# Patient Record
Sex: Female | Born: 1965 | Race: White | Hispanic: No | Marital: Married | Smoking: Former smoker
Health system: Southern US, Community
[De-identification: ages and names within clinical notes are randomized; demographics above are authoritative.]

## PROBLEM LIST (undated history)

## (undated) ENCOUNTER — Ambulatory Visit: Admission: EM | Discharge status: Against Medical Advice | Payer: 59 | Source: Home / Self Care

## (undated) DIAGNOSIS — M1990 Unspecified osteoarthritis, unspecified site: Secondary | ICD-10-CM

## (undated) DIAGNOSIS — M329 Systemic lupus erythematosus, unspecified: Secondary | ICD-10-CM

## (undated) DIAGNOSIS — R569 Unspecified convulsions: Secondary | ICD-10-CM

## (undated) DIAGNOSIS — G629 Polyneuropathy, unspecified: Secondary | ICD-10-CM

## (undated) DIAGNOSIS — L930 Discoid lupus erythematosus: Secondary | ICD-10-CM

## (undated) DIAGNOSIS — Z22322 Carrier or suspected carrier of Methicillin resistant Staphylococcus aureus: Secondary | ICD-10-CM

## (undated) DIAGNOSIS — I10 Essential (primary) hypertension: Secondary | ICD-10-CM

## (undated) DIAGNOSIS — L719 Rosacea, unspecified: Secondary | ICD-10-CM

## (undated) DIAGNOSIS — E785 Hyperlipidemia, unspecified: Secondary | ICD-10-CM

## (undated) DIAGNOSIS — F419 Anxiety disorder, unspecified: Secondary | ICD-10-CM

## (undated) HISTORY — PX: GANGLION CYST EXCISION: SHX1691

## (undated) HISTORY — PX: ABDOMINAL HYSTERECTOMY: SHX81

---

## 2009-12-10 ENCOUNTER — Ambulatory Visit: Payer: Self-pay | Admitting: Family Medicine

## 2012-10-30 ENCOUNTER — Ambulatory Visit: Payer: Self-pay | Admitting: Family Medicine

## 2013-01-28 ENCOUNTER — Ambulatory Visit: Payer: Self-pay | Admitting: Physician Assistant

## 2014-01-30 ENCOUNTER — Ambulatory Visit: Payer: Self-pay | Admitting: Obstetrics and Gynecology

## 2015-06-12 ENCOUNTER — Encounter: Payer: Self-pay | Admitting: *Deleted

## 2015-06-13 ENCOUNTER — Encounter
Admission: RE | Disposition: A | Discharge status: Home Health | Payer: Self-pay | Source: Ambulatory Visit | Attending: Gastroenterology

## 2015-06-13 ENCOUNTER — Ambulatory Visit
Admission: RE | Admit: 2015-06-13 | Discharge: 2015-06-13 | Disposition: A | Discharge status: Home Health | Payer: BLUE CROSS/BLUE SHIELD | Source: Ambulatory Visit | Attending: Gastroenterology | Admitting: Gastroenterology

## 2015-06-13 ENCOUNTER — Encounter: Payer: Self-pay | Admitting: *Deleted

## 2015-06-13 ENCOUNTER — Ambulatory Visit: Payer: BLUE CROSS/BLUE SHIELD | Admitting: Certified Registered"

## 2015-06-13 DIAGNOSIS — Z8614 Personal history of Methicillin resistant Staphylococcus aureus infection: Secondary | ICD-10-CM | POA: Insufficient documentation

## 2015-06-13 DIAGNOSIS — Z881 Allergy status to other antibiotic agents status: Secondary | ICD-10-CM | POA: Insufficient documentation

## 2015-06-13 DIAGNOSIS — I10 Essential (primary) hypertension: Secondary | ICD-10-CM | POA: Insufficient documentation

## 2015-06-13 DIAGNOSIS — Z87891 Personal history of nicotine dependence: Secondary | ICD-10-CM | POA: Insufficient documentation

## 2015-06-13 DIAGNOSIS — K621 Rectal polyp: Secondary | ICD-10-CM | POA: Insufficient documentation

## 2015-06-13 DIAGNOSIS — M1990 Unspecified osteoarthritis, unspecified site: Secondary | ICD-10-CM | POA: Insufficient documentation

## 2015-06-13 DIAGNOSIS — F419 Anxiety disorder, unspecified: Secondary | ICD-10-CM | POA: Diagnosis not present

## 2015-06-13 DIAGNOSIS — R194 Change in bowel habit: Secondary | ICD-10-CM | POA: Diagnosis present

## 2015-06-13 DIAGNOSIS — Z882 Allergy status to sulfonamides status: Secondary | ICD-10-CM | POA: Diagnosis not present

## 2015-06-13 DIAGNOSIS — E785 Hyperlipidemia, unspecified: Secondary | ICD-10-CM | POA: Diagnosis not present

## 2015-06-13 DIAGNOSIS — Z888 Allergy status to other drugs, medicaments and biological substances status: Secondary | ICD-10-CM | POA: Diagnosis not present

## 2015-06-13 DIAGNOSIS — R1032 Left lower quadrant pain: Secondary | ICD-10-CM | POA: Diagnosis not present

## 2015-06-13 DIAGNOSIS — H01129 Discoid lupus erythematosus of unspecified eye, unspecified eyelid: Secondary | ICD-10-CM | POA: Diagnosis not present

## 2015-06-13 HISTORY — DX: Unspecified convulsions: R56.9

## 2015-06-13 HISTORY — PX: COLONOSCOPY WITH PROPOFOL: SHX5780

## 2015-06-13 HISTORY — DX: Hyperlipidemia, unspecified: E78.5

## 2015-06-13 HISTORY — DX: Discoid lupus erythematosus: L93.0

## 2015-06-13 HISTORY — DX: Polyneuropathy, unspecified: G62.9

## 2015-06-13 HISTORY — DX: Essential (primary) hypertension: I10

## 2015-06-13 HISTORY — DX: Rosacea, unspecified: L71.9

## 2015-06-13 HISTORY — DX: Anxiety disorder, unspecified: F41.9

## 2015-06-13 HISTORY — DX: Unspecified osteoarthritis, unspecified site: M19.90

## 2015-06-13 HISTORY — DX: Carrier or suspected carrier of methicillin resistant Staphylococcus aureus: Z22.322

## 2015-06-13 SURGERY — COLONOSCOPY WITH PROPOFOL
Anesthesia: General

## 2015-06-13 MED ORDER — SODIUM CHLORIDE 0.9 % IV SOLN
INTRAVENOUS | Status: DC
  Administered 2015-06-13: 15:00:00 via INTRAVENOUS

## 2015-06-13 MED ORDER — LIDOCAINE HCL (CARDIAC) 20 MG/ML IV SOLN
INTRAVENOUS | Status: DC | PRN
  Administered 2015-06-13: 50 mg via INTRAVENOUS

## 2015-06-13 MED ORDER — FENTANYL CITRATE (PF) 100 MCG/2ML IJ SOLN
INTRAMUSCULAR | Status: DC | PRN
  Administered 2015-06-13: 50 ug via INTRAVENOUS

## 2015-06-13 MED ORDER — GLYCOPYRROLATE 0.2 MG/ML IJ SOLN
INTRAMUSCULAR | Status: DC | PRN
  Administered 2015-06-13: 0.2 mg via INTRAVENOUS

## 2015-06-13 MED ORDER — MIDAZOLAM HCL 5 MG/5ML IJ SOLN
INTRAMUSCULAR | Status: DC | PRN
  Administered 2015-06-13: 1 mg via INTRAVENOUS

## 2015-06-13 MED ORDER — PROPOFOL 10 MG/ML IV BOLUS
INTRAVENOUS | Status: DC | PRN
  Administered 2015-06-13: 70 mg via INTRAVENOUS

## 2015-06-13 MED ORDER — PROPOFOL 500 MG/50ML IV EMUL
INTRAVENOUS | Status: DC | PRN
  Administered 2015-06-13: 120 ug/kg/min via INTRAVENOUS

## 2015-06-13 MED ORDER — SODIUM CHLORIDE 0.9 % IV SOLN: INTRAVENOUS | Status: DC

## 2015-06-13 NOTE — H&P (Signed)
Outpatient short stay form Pre-procedure 06/13/2015 3:14 PM Rebecca DeemMartin U Skulskie MD  Primary Physician: Dr. Jerl MinaJames Hedrick  Reason for visit:  Left lower quadrant pain, diarrhea, change of bowel habits  History of present illness:  Patient is a 49 year old female presenting with a long history of possible irritable bowel syndrome. She states this began when she was quite young. She has alternating bowel habits and incomplete defecation symptoms. He recently had several courses of antibiotics. She has had no previous luminal evaluation. She tolerated her prep well. She takes no aspirin or blood thinning products.  He did relate that eating a fair number of fiber one bars, also has symptoms of loose stools and gas when she eats ice cream. Raises the question of possible lactose maldigestion.    Current facility-administered medications:  .  0.9 %  sodium chloride infusion, , Intravenous, Continuous, Rebecca DeemMartin U Skulskie, MD, Last Rate: 20 mL/hr at 06/13/15 1445 .  0.9 %  sodium chloride infusion, , Intravenous, Continuous, Rebecca DeemMartin U Skulskie, MD  Prescriptions prior to admission  Medication Sig Dispense Refill Last Dose  . clonazePAM (KLONOPIN) 1 MG tablet Take 1 mg by mouth 2 (two) times daily.   06/13/2015 at 1000  . cyanocobalamin (,VITAMIN B-12,) 1000 MCG/ML injection Inject 1,000 mcg into the muscle once.   Past Month at Unknown time  . enalapril (VASOTEC) 20 MG tablet Take 30 mg by mouth daily.   06/13/2015 at 1000  . ergocalciferol (VITAMIN D2) 50000 UNITS capsule Take 50,000 Units by mouth once a week.   Past Week at Unknown time  . estradiol (ESTRACE) 2 MG tablet Take 2 mg by mouth daily.   06/13/2015 at 1000  . gabapentin (NEURONTIN) 300 MG capsule Take 300 mg by mouth 2 (two) times daily. Take one capsule every morning and two capsules every night p.o.   06/13/2015 at 1000  . metoprolol tartrate (LOPRESSOR) 25 MG tablet Take 12.5 mg by mouth 2 (two) times daily.   06/13/2015 at 1000  .  PARoxetine (PAXIL) 10 MG tablet Take 10 mg by mouth daily.   06/12/2015 at Unknown time  . hydroxychloroquine (PLAQUENIL) 200 MG tablet Take by mouth daily.   Not Taking at Unknown time     Allergies  Allergen Reactions  . Amoxicillin   . Dilantin [Phenytoin Sodium Extended]   . Doxycycline   . Phenobarbital   . Sulfa Antibiotics      Past Medical History  Diagnosis Date  . Hypertension   . Anxiety   . Seizures (HCC)   . Arthritis   . Hyperlipidemia   . Discoid lupus   . Rosacea   . Neuropathy (HCC)   . MRSA (methicillin resistant staph aureus) culture positive     Review of systems:      Physical Exam    Heart and lungs: Irregular rate and rhythm without rub or gallop, lungs are bilaterally clear    HEENT: Normocephalic atraumatic eyes are anicteric    Other:     Pertinant exam for procedure: Soft nontender nondistended bowel sounds positive normoactive    Planned proceedures: Colonoscopy and indicated procedures. I have discussed the risks benefits and complications of procedures to include not limited to bleeding, infection, perforation and the risk of sedation and the patient wishes to proceed.    Rebecca DeemMartin U Skulskie, MD Gastroenterology 06/13/2015  3:14 PM

## 2015-06-13 NOTE — Anesthesia Preprocedure Evaluation (Signed)
Anesthesia Evaluation  Patient identified by MRN, date of birth, ID band Patient awake    Reviewed: Allergy & Precautions, H&P , NPO status , Patient's Chart, lab work & pertinent test results  Airway Mallampati: II  TM Distance: >3 FB Neck ROM: full    Dental  (+) Poor Dentition   Pulmonary neg pulmonary ROS, former smoker,    Pulmonary exam normal breath sounds clear to auscultation       Cardiovascular Exercise Tolerance: Good hypertension, (-) angina(-) Past MI and (-) DOE Normal cardiovascular exam Rhythm:regular Rate:Normal     Neuro/Psych Seizures -, Well Controlled,  PSYCHIATRIC DISORDERS Anxiety    GI/Hepatic negative GI ROS, Neg liver ROS,   Endo/Other  negative endocrine ROS  Renal/GU negative Renal ROS  negative genitourinary   Musculoskeletal  (+) Arthritis ,   Abdominal   Peds  Hematology negative hematology ROS (+)   Anesthesia Other Findings Past Medical History:   Hypertension                                                 Anxiety                                                      Seizures (HCC)                                               Arthritis                                                    Hyperlipidemia                                               Discoid lupus                                                Rosacea                                                      Neuropathy (HCC)                                             MRSA (methicillin resistant staph aureus) cult*             Past Surgical History:   ABDOMINAL HYSTERECTOMY  GANGLION CYST EXCISION                                       BMI    Body Mass Index   22.28 kg/m 2      Reproductive/Obstetrics negative OB ROS                             Anesthesia Physical Anesthesia Plan  ASA: III  Anesthesia Plan: General   Post-op Pain Management:     Induction:   Airway Management Planned:   Additional Equipment:   Intra-op Plan:   Post-operative Plan:   Informed Consent: I have reviewed the patients History and Physical, chart, labs and discussed the procedure including the risks, benefits and alternatives for the proposed anesthesia with the patient or authorized representative who has indicated his/her understanding and acceptance.   Dental Advisory Given  Plan Discussed with: Anesthesiologist, CRNA and Surgeon  Anesthesia Plan Comments:         Anesthesia Quick Evaluation

## 2015-06-13 NOTE — Anesthesia Postprocedure Evaluation (Signed)
  Anesthesia Post-op Note  Patient: Rebecca PointsSharon L Boone  Procedure(s) Performed: Procedure(s): COLONOSCOPY WITH PROPOFOL (N/A)  Anesthesia type:General  Patient location: PACU  Post pain: Pain level controlled  Post assessment: Post-op Vital signs reviewed, Patient's Cardiovascular Status Stable, Respiratory Function Stable, Patent Airway and No signs of Nausea or vomiting  Post vital signs: Reviewed and stable  Last Vitals:  Filed Vitals:   06/13/15 1630  BP: 136/76  Pulse: 52  Temp:   Resp: 11    Level of consciousness: awake, alert  and patient cooperative  Complications: No apparent anesthesia complications

## 2015-06-13 NOTE — Op Note (Signed)
[facility] Gastroenterology Patient Name: Merna Baldi Procedure Date: 06/13/2015 3:09 PM MRN: [id number] Account #: [id number] Date of Birth: 07-10-1966 Admit Type: Outpatient Age: 49 Room: [facility] ENDO ROOM 3 Gender: Female Note Status: Finalized Procedure:         Colonoscopy Indications:       Change in bowel habits, Diarrhea Providers:         Christena Deem, MD Referring MD:      Christena Deem, MD (Referring MD) Medicines:         Monitored Anesthesia Care Complications:     No immediate complications. Procedure:         Pre-Anesthesia Assessment:                    - ASA Grade Assessment: II - A patient with mild systemic                     disease.                    After obtaining informed consent, the colonoscope was                     passed under direct vision. Throughout the procedure, the                     patient's blood pressure, pulse, and oxygen saturations                     were monitored continuously. The Colonoscope was                     introduced through the anus and advanced to the the cecum,                     identified by appendiceal orifice and ileocecal valve. The                     quality of the bowel preparation was good. The colonoscopy                     was performed without difficulty. The patient tolerated                     the procedure well. Findings:      The sigmoid colon was moderately redundant.      A 2 mm polyp was found in the recto-sigmoid colon. The polyp was       sessile. The polyp was removed with a cold biopsy forceps. Resection and       retrieval were complete.      Three sessile polyps were found in the rectum. The polyps were less than       1 mm in size. These polyps were removed with a cold biopsy forceps.       Resection and retrieval were complete.      The digital rectal exam was normal.      Biopsies for histology were taken with a cold forceps from the right       colon  and left colon for evaluation of microscopic colitis. Impression:        - Redundant colon.                    - One 2 mm polyp at the recto-sigmoid colon.  Resected and                     retrieved.                    - Three less than 1 mm polyps in the rectum. Resected and                     retrieved.                    - Biopsies were taken with a cold forceps from the right                     colon and left colon for evaluation of microscopic colitis. Recommendation:    - Await pathology results.                    - Telephone GI clinic for pathology results in 1 week.                    - Use Citrucel one tablespoon PO daily daily.                    - Check lactose tolerance test at appointment to be                     scheduled.                    - Return to GI clinic in 4 weeks. Procedure Code(s): --- Professional ---                    [phone], Colonoscopy, flexible; with biopsy, single or                     multiple Diagnosis Code(s): --- Professional ---                    211.4, Benign neoplasm of rectum and anal canal                    569.0, Anal and rectal polyp                    787.99, Other symptoms involving digestive system                    787.91, Diarrhea                    751.5, Other anomalies of intestine CPT copyright 2014 American Medical Association. All rights reserved. The codes documented in this report are preliminary and upon coder review may  be revised to meet current compliance requirements. Christena DeemMartin U Humza Tallerico, MD 06/13/2015 3:57:19 PM This report has been signed electronically. Number of Addenda: 0 Note Initiated On: 06/13/2015 3:09 PM Scope Withdrawal Time: 0 hours 17 minutes 59 seconds  Total Procedure Duration: 0 hours 27 minutes 2 seconds       [facility]

## 2015-06-13 NOTE — Transfer of Care (Signed)
Immediate Anesthesia Transfer of Care Note  Patient: Rebecca Boone  Procedure(s) Performed: Procedure(s): COLONOSCOPY WITH PROPOFOL (N/A)  Patient Location: Endoscopy Unit  Anesthesia Type:General  Level of Consciousness: awake  Airway & Oxygen Therapy: Patient Spontanous Breathing and Patient connected to nasal cannula oxygen  Post-op Assessment: Report given to RN  Post vital signs: Reviewed  Last Vitals:  Filed Vitals:   06/13/15 1558  BP: 114/64  Pulse: 51  Temp: 36.4 C  Resp: 14    Complications: No apparent anesthesia complications

## 2015-06-17 LAB — SURGICAL PATHOLOGY

## 2015-06-18 ENCOUNTER — Encounter: Payer: Self-pay | Admitting: Gastroenterology

## 2015-12-03 DIAGNOSIS — Z8669 Personal history of other diseases of the nervous system and sense organs: Secondary | ICD-10-CM

## 2017-04-01 ENCOUNTER — Other Ambulatory Visit: Payer: Self-pay | Admitting: Family Medicine

## 2017-04-01 DIAGNOSIS — Z1231 Encounter for screening mammogram for malignant neoplasm of breast: Secondary | ICD-10-CM

## 2019-12-20 ENCOUNTER — Other Ambulatory Visit: Payer: Self-pay | Admitting: Family Medicine

## 2019-12-20 DIAGNOSIS — Z1231 Encounter for screening mammogram for malignant neoplasm of breast: Secondary | ICD-10-CM

## 2019-12-31 ENCOUNTER — Inpatient Hospital Stay: Admission: RE | Admit: 2019-12-31 | Payer: BLUE CROSS/BLUE SHIELD | Source: Ambulatory Visit

## 2020-01-14 ENCOUNTER — Ambulatory Visit
Admission: RE | Admit: 2020-01-14 | Discharge: 2020-01-14 | Disposition: A | Discharge status: Home / Self Care | Payer: 59 | Source: Ambulatory Visit | Attending: Family Medicine | Admitting: Family Medicine

## 2020-01-14 ENCOUNTER — Other Ambulatory Visit: Payer: Self-pay

## 2020-01-14 DIAGNOSIS — Z1231 Encounter for screening mammogram for malignant neoplasm of breast: Secondary | ICD-10-CM | POA: Insufficient documentation

## 2020-02-26 ENCOUNTER — Encounter: Payer: Self-pay | Admitting: Emergency Medicine

## 2020-02-26 ENCOUNTER — Other Ambulatory Visit: Payer: Self-pay

## 2020-02-26 ENCOUNTER — Ambulatory Visit
Admission: EM | Admit: 2020-02-26 | Discharge: 2020-02-26 | Disposition: A | Discharge status: Home / Self Care | Payer: 59 | Attending: Emergency Medicine | Admitting: Emergency Medicine

## 2020-02-26 DIAGNOSIS — N10 Acute tubulo-interstitial nephritis: Secondary | ICD-10-CM

## 2020-02-26 LAB — URINALYSIS, COMPLETE (UACMP) WITH MICROSCOPIC
Glucose, UA: 250 mg/dL — AB
Ketones, ur: 15 mg/dL — AB
Nitrite: POSITIVE — AB
Protein, ur: >300 mg/dL — AB
Specific Gravity, Urine: 1.01 (ref 1.005–1.030)
WBC, UA: >50 WBC/hpf (ref 0–5)
pH: 5 (ref 5.0–8.0)

## 2020-02-26 MED ORDER — LEVOFLOXACIN 750 MG PO TABS: 750.0000 mg | ORAL_TABLET | Freq: Every day | ORAL | 0 refills | Status: AC

## 2020-02-26 NOTE — ED Provider Notes (Signed)
[facility]    CSN: [id number] Arrival date & time: 02/26/20  1654      History   Chief Complaint Chief Complaint  Patient presents with  . Urinary Retention  . Dysuria    HPI Rebecca Boone is a 54 y.o. female.   Patient is a 54 year old female who presents with chief complaint of urinary symptoms that began last night.  Patient states she had a UTI back in May.  Chart review notes a urine culture from May 12 that returned as a pansensitive E. coli.  Patient was treated with a course of Ceftin.  Patient states her symptoms improved but she had a itchy reaction to the medication, thought to be a crossover from her penicillin allergy.  Patient ports last night she had some pain, urgency, and a bloating sensation.  Patient reports the pain was throughout the urination and afterwards.  Patient also reports left lower back pain as well.  Patient's she has been drinking a lot of water and took Azo last night as well as this morning and this afternoon.  Patient also reports chills and fever last night.     Past Medical History:  Diagnosis Date  . Anxiety   . Arthritis   . Discoid lupus   . Hyperlipidemia   . Hypertension   . MRSA (methicillin resistant staph aureus) culture positive   . Neuropathy   . Rosacea   . Seizures (HCC)     There are no problems to display for this patient.   Past Surgical History:  Procedure Laterality Date  . ABDOMINAL HYSTERECTOMY    . COLONOSCOPY WITH PROPOFOL N/A 06/13/2015   Procedure: COLONOSCOPY WITH PROPOFOL;  Surgeon: Christena Deem, MD;  Location: [facility] ENDOSCOPY;  Service: Endoscopy;  Laterality: N/A;  . GANGLION CYST EXCISION      OB History   No obstetric history on file.      Home Medications    Prior to Admission medications   Medication Sig Start Date End Date Taking? Authorizing Provider  clonazePAM (KLONOPIN) 1 MG tablet Take 1 mg by mouth 2 (two) times daily.   Yes [provider]    cyanocobalamin (,VITAMIN B-12,) 1000 MCG/ML injection Inject 1,000 mcg into the muscle once.   Yes [provider]  enalapril (VASOTEC) 20 MG tablet Take 30 mg by mouth daily.   Yes [provider]  ergocalciferol (VITAMIN D2) 50000 UNITS capsule Take 50,000 Units by mouth once a week.   Yes [provider]  gabapentin (NEURONTIN) 300 MG capsule Take 300 mg by mouth 2 (two) times daily. Take one capsule every morning and two capsules every night p.o.   Yes [provider]  metoprolol tartrate (LOPRESSOR) 25 MG tablet Take 12.5 mg by mouth 2 (two) times daily.   Yes [provider]  PARoxetine (PAXIL) 10 MG tablet Take 10 mg by mouth daily.   Yes [provider]  levofloxacin (LEVAQUIN) 750 MG tablet Take 1 tablet (750 mg total) by mouth daily for 5 days. 02/26/20 03/02/20  Candis Schatz, PA-C  estradiol (ESTRACE) 2 MG tablet Take 2 mg by mouth daily.  02/26/20  [provider]    Family History Family History  Problem Relation Age of Onset  . Healthy Mother     Social History Social History   Tobacco Use  . Smoking status: Former Games developer  . Smokeless tobacco: Never Used  Vaping Use  . Vaping Use: Never used  Substance Use  Topics  . Alcohol use: No  . Drug use: No     Allergies   Amoxicillin, Dilantin [phenytoin sodium extended], Doxycycline, Phenobarbital, and Sulfa antibiotics   Review of Systems Review of Systems as noted above in HPI.  Other system reviewed and found to be negative.   Physical Exam Triage Vital Signs ED Triage Vitals  Enc Vitals Group     BP 02/26/20 1723 133/69     Pulse Rate 02/26/20 1723 (!) 55     Resp 02/26/20 1723 18     Temp 02/26/20 1723 98.2 F (36.8 C)     Temp Source 02/26/20 1723 Oral     SpO2 02/26/20 1723 98 %     Weight 02/26/20 1719 138 lb 0.1 oz (62.6 kg)     Height 02/26/20 1719 5\' 6"  (1.676 m)     Head Circumference --      Peak Flow --      Pain Score 02/26/20  1719 6     Pain Loc --      Pain Edu? --      Excl. in GC? --    No data found.  Updated Vital Signs BP 133/69 (BP Location: Right Arm)   Pulse (!) 55   Temp 98.2 F (36.8 C) (Oral)   Resp 18   Ht 5\' 6"  (1.676 m)   Wt 138 lb 0.1 oz (62.6 kg)   SpO2 98%   BMI 22.28 kg/m   Visual Acuity Right Eye Distance:   Left Eye Distance:   Bilateral Distance:    Right Eye Near:   Left Eye Near:    Bilateral Near:     Physical Exam Constitutional:      Appearance: Normal appearance. She is not ill-appearing.  Cardiovascular:     Rate and Rhythm: Normal rate and regular rhythm.     Heart sounds: Normal heart sounds.  Pulmonary:     Effort: Pulmonary effort is normal.     Breath sounds: Normal breath sounds.  Abdominal:     General: Abdomen is flat.     Palpations: Abdomen is soft.     Tenderness: There is left CVA tenderness. There is no right CVA tenderness.  Skin:    General: Skin is warm and dry.     Capillary Refill: Capillary refill takes less than 2 seconds.  Neurological:     General: No focal deficit present.     Mental Status: She is alert and oriented to person, place, and time.      UC Treatments / Results  Labs (all labs ordered are listed, but only abnormal results are displayed) Labs Reviewed  URINALYSIS, COMPLETE (UACMP) WITH MICROSCOPIC - Abnormal; Notable for the following components:      Result Value   Color, Urine ORANGE (*)    APPearance CLOUDY (*)    Glucose, UA 250 (*)    Hgb urine dipstick MODERATE (*)    Bilirubin Urine MODERATE (*)    Ketones, ur 15 (*)    Protein, ur >300 (*)    Nitrite POSITIVE (*)    Leukocytes,Ua LARGE (*)    Bacteria, UA MANY (*)    All other components within normal limits  URINE CULTURE    EKG   Radiology No results found.  Procedures Procedures (including critical care time)  Medications Ordered in UC Medications - No data to display  Initial Impression / Assessment and Plan / UC Course  I have  reviewed the triage vital signs  and the nursing notes.  Pertinent labs & imaging results that were available during my care of the patient were reviewed by me and considered in my medical decision making (see chart for details).    Patient with urinary symptoms.  UTI as noted above and urinalysis culture ordered.  Recent E. coli UTI treated with Ceftin.  Based on patient's flank pain and acute onset of symptoms, will treat her as a pyelonephritis.  We will treat her with Levaquin 750 mg daily for 5 days.  Recommend her push fluids.  Ibuprofen Tylenol for pain.  Follow-up should her symptoms worsen or not improve. Final Clinical Impressions(s) / UC Diagnoses   Final diagnoses:  Acute pyelonephritis     Discharge Instructions     -Levaquin: 1 tablet daily for 5 days -Push fluids -Ibuprofen and Tylenol as needed for pain -Follow with primary care provider or this clinic should your symptoms worsen or not improve.    ED Prescriptions    Medication Sig Dispense Auth. Provider   levofloxacin (LEVAQUIN) 750 MG tablet Take 1 tablet (750 mg total) by mouth daily for 5 days. 5 tablet Candis Schatz, PA-C     PDMP not reviewed this encounter.   Candis Schatz, PA-C 02/26/20 1900

## 2020-02-26 NOTE — Discharge Instructions (Addendum)
-  Levaquin: 1 tablet daily for 5 days -Push fluids -Ibuprofen and Tylenol as needed for pain -Follow with primary care provider or this clinic should your symptoms worsen or not improve. -We will also follow with primary care provider in regards to the glucose in your urine

## 2020-02-26 NOTE — ED Triage Notes (Signed)
Pt c/o urinary urgency, bladder pain. Started last night. She states she has had UTI's on and off for several months and does not think they are getting cleared up each time. She states she had chills last night but did not check her temperature.

## 2020-03-01 LAB — URINE CULTURE: Culture: >=100000 — AB

## 2020-03-30 ENCOUNTER — Encounter: Payer: Self-pay | Admitting: Emergency Medicine

## 2020-03-30 ENCOUNTER — Ambulatory Visit
Admission: EM | Admit: 2020-03-30 | Discharge: 2020-03-30 | Disposition: A | Discharge status: Home / Self Care | Payer: 59 | Attending: Family Medicine | Admitting: Family Medicine

## 2020-03-30 ENCOUNTER — Other Ambulatory Visit: Payer: Self-pay

## 2020-03-30 DIAGNOSIS — L03211 Cellulitis of face: Secondary | ICD-10-CM

## 2020-03-30 MED ORDER — MUPIROCIN 2 % EX OINT: 1.0000 "application " | TOPICAL_OINTMENT | Freq: Two times a day (BID) | CUTANEOUS | 0 refills | Status: AC

## 2020-03-30 MED ORDER — CLINDAMYCIN HCL 150 MG PO CAPS: 450.0000 mg | ORAL_CAPSULE | Freq: Three times a day (TID) | ORAL | 0 refills | Status: AC

## 2020-03-30 NOTE — Discharge Instructions (Signed)
Medication as prescribed.  Take care  Dr. Enrika Aguado  

## 2020-03-30 NOTE — ED Triage Notes (Signed)
Patient states on Thursday she noticed redness on her chin.  Patient states that later that day she started having swelling in her chin and lower part of her face.

## 2020-03-30 NOTE — ED Provider Notes (Signed)
[facility]    CSN: [id number] Arrival date & time: 03/30/20  1108      History   Chief Complaint Chief Complaint  Patient presents with   Facial Swelling   HPI 54 year old female presents with the above complaint.  Patient reports that on Thursday she noticed an area of swelling and redness on the left side of her chin.  Since Thursday it has progressed and gotten worse.  The area is now quite swollen, erythematous and tender.  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  She has a history of MRSA.  She believes that this area is infected.  Pain 8/10 in severity.  She has been applying mupirocin without relief.  She has an allergy to doxycycline as well as sulfa drugs.  No fever.  No other associated symptoms.  No other complaints.  Past Medical History:  Diagnosis Date   Anxiety    Arthritis    Discoid lupus    Hyperlipidemia    Hypertension    MRSA (methicillin resistant staph aureus) culture positive    Neuropathy    Rosacea    Seizures (HCC)    Past Surgical History:  Procedure Laterality Date   ABDOMINAL HYSTERECTOMY     COLONOSCOPY WITH PROPOFOL N/A 06/13/2015   Procedure: COLONOSCOPY WITH PROPOFOL;  Surgeon: Christena Deem, MD;  Location: [facility] ENDOSCOPY;  Service: Endoscopy;  Laterality: N/A;   GANGLION CYST EXCISION      OB History   No obstetric history on file.      Home Medications    Prior to Admission medications   Medication Sig Start Date End Date Taking? Authorizing Provider  clonazePAM (KLONOPIN) 1 MG tablet Take 1 mg by mouth 2 (two) times daily.   Yes [provider]  cyanocobalamin (,VITAMIN B-12,) 1000 MCG/ML injection Inject 1,000 mcg into the muscle once.   Yes [provider]  enalapril (VASOTEC) 20 MG tablet Take 30 mg by mouth daily.   Yes [provider]  ergocalciferol (VITAMIN D2) 50000 UNITS capsule Take 50,000 Units by mouth once a week.   Yes  [provider]  gabapentin (NEURONTIN) 300 MG capsule Take 300 mg by mouth 2 (two) times daily. Take one capsule every morning and two capsules every night p.o.   Yes [provider]  metoprolol tartrate (LOPRESSOR) 25 MG tablet Take 12.5 mg by mouth 2 (two) times daily.   Yes [provider]  PARoxetine (PAXIL) 10 MG tablet Take 10 mg by mouth daily.   Yes [provider]  clindamycin (CLEOCIN) 150 MG capsule Take 3 capsules (450 mg total) by mouth 3 (three) times daily for 7 days. 03/30/20 04/06/20  Tommie Sams, DO  mupirocin ointment (BACTROBAN) 2 % Apply 1 application topically 2 (two) times daily for 7 days. 03/30/20 04/06/20  Tommie Sams, DO  estradiol (ESTRACE) 2 MG tablet Take 2 mg by mouth daily.  02/26/20  [provider]    Family History Family History  Problem Relation Age of Onset   Healthy Mother     Social History Social History   Tobacco Use   Smoking status: Former Smoker   Smokeless tobacco: Never Used  Building services engineer Use: Never used  Substance Use Topics   Alcohol use: No   Drug use: No     Allergies   Sulfa antibiotics, Amoxicillin, Dilantin [phenytoin sodium extended], Doxycycline, and Phenobarbital   Review of Systems  Review of Systems  Constitutional: Negative for fever.  HENT:       Area of redness & swelling (chin).   Physical Exam Triage Vital Signs ED Triage Vitals  Enc Vitals Group     BP 03/30/20 1124 (!) 145/79     Pulse Rate 03/30/20 1124 52     Resp 03/30/20 1124 14     Temp 03/30/20 1124 98.4 F (36.9 C)     Temp Source 03/30/20 1124 Oral     SpO2 03/30/20 1124 100 %     Weight 03/30/20 1121 142 lb (64.4 kg)     Height 03/30/20 1121 5\' 5"  (1.651 m)     Head Circumference --      Peak Flow --      Pain Score 03/30/20 1121 8     Pain Loc --      Pain Edu? --      Excl. in GC? --    Updated Vital Signs BP (!) 145/79 (BP Location: Left Arm)    Pulse 52    Temp 98.4 F (36.9  C) (Oral)    Resp 14    Ht 5\' 5"  (1.651 m)    Wt 64.4 kg    SpO2 100%    BMI 23.63 kg/m   Visual Acuity Right Eye Distance:   Left Eye Distance:   Bilateral Distance:    Right Eye Near:   Left Eye Near:    Bilateral Near:     Physical Exam Vitals and nursing note reviewed.  Constitutional:      General: She is not in acute distress.    Appearance: Normal appearance. She is not ill-appearing.  HENT:     Head: Normocephalic and atraumatic.      Comments: Raised area and erythema warmth at the labelled location.    Mouth/Throat:     Pharynx: Oropharynx is clear. No oropharyngeal exudate or posterior oropharyngeal erythema.  Eyes:     General:        Right eye: No discharge.        Left eye: No discharge.     Conjunctiva/sclera: Conjunctivae normal.  Pulmonary:     Effort: Pulmonary effort is normal. No respiratory distress.  Neurological:     Mental Status: She is alert.  Psychiatric:        Mood and Affect: Mood normal.        Behavior: Behavior normal.    UC Treatments / Results  Labs (all labs ordered are listed, but only abnormal results are displayed) Labs Reviewed - No data to display  EKG   Radiology No results found.  Procedures Procedures (including critical care time)  Medications Ordered in UC Medications - No data to display  Initial Impression / Assessment and Plan / UC Course  I have reviewed the triage vital signs and the nursing notes.  Pertinent labs & imaging results that were available during my care of the patient were reviewed by me and considered in my medical decision making (see chart for details).    54 year old female presents with facial cellulitis.  Treating with clindamycin and Bactroban ointment.  Final Clinical Impressions(s) / UC Diagnoses   Final diagnoses:  Cellulitis of face     Discharge Instructions     Medication as prescribed.  Take care  Dr.    ED Prescriptions    Medication Sig Dispense Auth.  Provider   clindamycin (CLEOCIN) 150 MG capsule Take 3 capsules (450 mg total) by mouth  3 (three) times daily for 7 days. 63 capsule Zeph Riebel G, DO   mupirocin ointment (BACTROBAN) 2 % Apply 1 application topically 2 (two) times daily for 7 days. 22 g Tommie Sams, DO     PDMP not reviewed this encounter.   Tommie Sams, [place] 03/30/20 1156

## 2020-04-07 DIAGNOSIS — L930 Discoid lupus erythematosus: Secondary | ICD-10-CM | POA: Diagnosis present

## 2020-05-02 ENCOUNTER — Ambulatory Visit
Admission: EM | Admit: 2020-05-02 | Discharge: 2020-05-02 | Disposition: A | Discharge status: Home / Self Care | Payer: 59 | Attending: Physician Assistant | Admitting: Physician Assistant

## 2020-05-02 ENCOUNTER — Encounter: Payer: Self-pay | Admitting: Emergency Medicine

## 2020-05-02 ENCOUNTER — Other Ambulatory Visit: Payer: Self-pay

## 2020-05-02 DIAGNOSIS — N3090 Cystitis, unspecified without hematuria: Secondary | ICD-10-CM | POA: Diagnosis not present

## 2020-05-02 LAB — URINALYSIS, COMPLETE (UACMP) WITH MICROSCOPIC
Bilirubin Urine: NEGATIVE
Glucose, UA: NEGATIVE mg/dL
Hgb urine dipstick: NEGATIVE
Ketones, ur: NEGATIVE mg/dL
Nitrite: NEGATIVE
Protein, ur: NEGATIVE mg/dL
Specific Gravity, Urine: 1.025 (ref 1.005–1.030)
pH: 6 (ref 5.0–8.0)

## 2020-05-02 MED ORDER — NITROFURANTOIN MONOHYD MACRO 100 MG PO CAPS: 100.0000 mg | ORAL_CAPSULE | Freq: Two times a day (BID) | ORAL | 0 refills | Status: AC

## 2020-05-02 MED ORDER — FLUCONAZOLE 200 MG PO TABS: 200.0000 mg | ORAL_TABLET | Freq: Once | ORAL | 0 refills | Status: AC

## 2020-05-02 NOTE — ED Triage Notes (Signed)
Patient c/o burning when urinating and urinary urgency that started a week ago.

## 2020-05-02 NOTE — Discharge Instructions (Signed)
Mild UTI on urine sample today, I have sent a culture to confirm this.  We will start antibiotics in the  mean time.  You may use AZO as needed.  Drink plenty of water to empty bladder regularly. Avoid alcohol and caffeine as these may irritate the bladder.   There is no sugar to your urine specimen today.  Continue to follow with your PCP for recheck and long term management.  Return if any worsening

## 2020-05-02 NOTE — ED Provider Notes (Signed)
[facility]    CSN: [id number] Arrival date & time: 05/02/20  1218      History   Chief Complaint Chief Complaint  Patient presents with  . Dysuria  . Urinary Urgency    HPI Rebecca Boone is a 54 y.o. female.   Rebecca Boone presents with complaints of dysuria,  Urgency and frequency which started a week ago. Some back pain. No fevers. Sensation of needing to void and sometimes no output. Has had frequent uti's recently, approximately once a month. Was treated for pyelo 02/26/20. History of lupus. Was recently on antibiotics for cellulitis and feels she is also with a yeast infection.    ROS per HPI, negative if not otherwise mentioned.      Past Medical History:  Diagnosis Date  . Anxiety   . Arthritis   . Discoid lupus   . Hyperlipidemia   . Hypertension   . MRSA (methicillin resistant staph aureus) culture positive   . Neuropathy   . Rosacea   . Seizures (HCC)     There are no problems to display for this patient.   Past Surgical History:  Procedure Laterality Date  . ABDOMINAL HYSTERECTOMY    . COLONOSCOPY WITH PROPOFOL N/A 06/13/2015   Procedure: COLONOSCOPY WITH PROPOFOL;  Surgeon: Christena Deem, MD;  Location: [facility] ENDOSCOPY;  Service: Endoscopy;  Laterality: N/A;  . GANGLION CYST EXCISION      OB History   No obstetric history on file.      Home Medications    Prior to Admission medications   Medication Sig Start Date End Date Taking? Authorizing Provider  clonazePAM (KLONOPIN) 1 MG tablet Take 1 mg by mouth 2 (two) times daily.    [provider]  cyanocobalamin (,VITAMIN B-12,) 1000 MCG/ML injection Inject 1,000 mcg into the muscle once.    [provider]  enalapril (VASOTEC) 20 MG tablet Take 30 mg by mouth daily.    [provider]  ergocalciferol (VITAMIN D2) 50000 UNITS capsule Take 50,000 Units by mouth once a week.    [provider]  fluconazole (DIFLUCAN) 200 MG tablet  Take 1 tablet (200 mg total) by mouth once for 1 dose. After completion of antibiotics 05/02/20 05/02/20  Linus Mako B, NP  gabapentin (NEURONTIN) 300 MG capsule Take 300 mg by mouth 2 (two) times daily. Take one capsule every morning and two capsules every night p.o.    [provider]  metoprolol tartrate (LOPRESSOR) 25 MG tablet Take 12.5 mg by mouth 2 (two) times daily.    [provider]  nitrofurantoin, macrocrystal-monohydrate, (MACROBID) 100 MG capsule Take 1 capsule (100 mg total) by mouth 2 (two) times daily for 5 days. 05/02/20 05/07/20  Georgetta Haber, NP  PARoxetine (PAXIL) 10 MG tablet Take 10 mg by mouth daily.    [provider]  estradiol (ESTRACE) 2 MG tablet Take 2 mg by mouth daily.  02/26/20  [provider]    Family History Family History  Problem Relation Age of Onset  . Healthy Mother     Social History Social History   Tobacco Use  . Smoking status: Former Games developer  . Smokeless tobacco: Never Used  Vaping Use  . Vaping Use: Never used  Substance Use Topics  . Alcohol use: No  . Drug use: No     Allergies   Sulfa antibiotics, Amoxicillin, Dilantin [phenytoin sodium extended], Doxycycline, and Phenobarbital   Review of Systems Review of Systems  Physical Exam Triage Vital Signs ED Triage Vitals  Enc Vitals Group     BP 05/02/20 1227 (!) 117/58     Pulse Rate 05/02/20 1227 (!) 50     Resp 05/02/20 1227 14     Temp 05/02/20 1227 97.6 F (36.4 C)     Temp Source 05/02/20 1227 Oral     SpO2 05/02/20 1227 99 %     Weight 05/02/20 1225 142 lb (64.4 kg)     Height 05/02/20 1225 5\' 5"  (1.651 m)     Head Circumference --      Peak Flow --      Pain Score 05/02/20 1225 8     Pain Loc --      Pain Edu? --      Excl. in GC? --    No data found.  Updated Vital Signs BP (!) 117/58 (BP Location: Left Arm)   Pulse (!) 50   Temp 97.6 F (36.4 C) (Oral)   Resp 14   Ht 5\' 5"  (1.651 m)   Wt 142 lb (64.4 kg)   SpO2  99%   BMI 23.63 kg/m    Physical Exam Constitutional:      General: She is not in acute distress.    Appearance: She is well-developed.  Cardiovascular:     Rate and Rhythm: Normal rate.  Pulmonary:     Effort: Pulmonary effort is normal.  Abdominal:     Tenderness: There is no abdominal tenderness. There is no right CVA tenderness or left CVA tenderness.  Skin:    General: Skin is warm and dry.  Neurological:     Mental Status: She is alert and oriented to person, place, and time.      UC Treatments / Results  Labs (all labs ordered are listed, but only abnormal results are displayed) Labs Reviewed  URINALYSIS, COMPLETE (UACMP) WITH MICROSCOPIC - Abnormal; Notable for the following components:      Result Value   APPearance HAZY (*)    Leukocytes,Ua TRACE (*)    Bacteria, UA FEW (*)    All other components within normal limits  URINE CULTURE    EKG   Radiology No results found.  Procedures Procedures (including critical care time)  Medications Ordered in UC Medications - No data to display  Initial Impression / Assessment and Plan / UC Course  I have reviewed the triage vital signs and the nursing notes.  Pertinent labs & imaging results that were available during my care of the patient were reviewed by me and considered in my medical decision making (see chart for details).     Trace leuks and few bacteria, symptoms of UTI, with macrobid initiated pending culture. Sensitive with last obtained culture. Return precautions provided. Patient verbalized understanding and agreeable to plan.   Final Clinical Impressions(s) / UC Diagnoses   Final diagnoses:  Cystitis     Discharge Instructions     Mild UTI on urine sample today, I have sent a culture to confirm this.  We will start antibiotics in the  mean time.  You may use AZO as needed.  Drink plenty of water to empty bladder regularly. Avoid alcohol and caffeine as these may irritate the bladder.     There is no sugar to your urine specimen today.  Continue to follow with your PCP for recheck and long term management.  Return if any worsening     ED Prescriptions    Medication Sig Dispense Auth. Provider  nitrofurantoin, macrocrystal-monohydrate, (MACROBID) 100 MG capsule Take 1 capsule (100 mg total) by mouth 2 (two) times daily for 5 days. 10 capsule Linus Mako B, NP   fluconazole (DIFLUCAN) 200 MG tablet Take 1 tablet (200 mg total) by mouth once for 1 dose. After completion of antibiotics 1 tablet Georgetta Haber, NP     PDMP not reviewed this encounter.   Georgetta Haber, NP 05/02/20 1253

## 2020-05-03 LAB — URINE CULTURE: Culture: <10000 — AB

## 2020-07-21 ENCOUNTER — Encounter: Payer: Self-pay | Admitting: Emergency Medicine

## 2020-07-21 ENCOUNTER — Ambulatory Visit
Admission: EM | Admit: 2020-07-21 | Discharge: 2020-07-21 | Disposition: A | Discharge status: Home / Self Care | Payer: 59

## 2020-07-21 ENCOUNTER — Other Ambulatory Visit: Payer: Self-pay

## 2020-07-21 DIAGNOSIS — Z8614 Personal history of Methicillin resistant Staphylococcus aureus infection: Secondary | ICD-10-CM

## 2020-07-21 DIAGNOSIS — L03211 Cellulitis of face: Secondary | ICD-10-CM

## 2020-07-21 MED ORDER — CLINDAMYCIN HCL 300 MG PO CAPS: 300.0000 mg | ORAL_CAPSULE | Freq: Four times a day (QID) | ORAL | 0 refills | Status: AC

## 2020-07-21 NOTE — ED Provider Notes (Signed)
[facility]    CSN: [id number] Arrival date & time: 07/21/20  1239      History   Chief Complaint Chief Complaint  Patient presents with  . Rash    HPI Rebecca Boone is a 54 y.o. female presenting for erythematous and painful lump on her chin.  She states that she woke up and noticed this.  Patient has a history of discoid lupus, rosacea, and MRSA.  She states that whenever she has a lupus flareup it can present with an infection that starts on her chin and affects her entire face.  She denies any fever.  She says that the swelling is not that bad at this point but it will definitely worsen if she does not start on medication.  Patient states that she has multiple allergies and was seen in our clinic several months ago and treated for cellulitis of her chin with clindamycin.  She states that this medication worked very well and resolved her symptoms within a couple of days.  She says that she has seen a dermatologist for this condition and uses antiseptic wash on her face every day.  Patient states that she does not have any other concerns today.  HPI  Past Medical History:  Diagnosis Date  . Anxiety   . Arthritis   . Discoid lupus   . Hyperlipidemia   . Hypertension   . MRSA (methicillin resistant staph aureus) culture positive   . Neuropathy   . Rosacea   . Seizures (HCC)     There are no problems to display for this patient.   Past Surgical History:  Procedure Laterality Date  . ABDOMINAL HYSTERECTOMY    . COLONOSCOPY WITH PROPOFOL N/A 06/13/2015   Procedure: COLONOSCOPY WITH PROPOFOL;  Surgeon: Christena Deem, MD;  Location: [facility] ENDOSCOPY;  Service: Endoscopy;  Laterality: N/A;  . GANGLION CYST EXCISION      OB History   No obstetric history on file.      Home Medications    Prior to Admission medications   Medication Sig Start Date End Date Taking? Authorizing Provider  clonazePAM (KLONOPIN) 1 MG tablet Take 1 mg by mouth 2 (two) times  daily.   Yes [provider]  cyanocobalamin (,VITAMIN B-12,) 1000 MCG/ML injection Inject 1,000 mcg into the muscle once.   Yes [provider]  enalapril (VASOTEC) 20 MG tablet Take 30 mg by mouth daily.   Yes [provider]  ergocalciferol (VITAMIN D2) 50000 UNITS capsule Take 50,000 Units by mouth once a week.   Yes [provider]  estradiol (ESTRACE) 0.5 MG tablet Take 0.5 mg by mouth daily. 04/17/20  Yes [provider]  gabapentin (NEURONTIN) 300 MG capsule Take 300 mg by mouth 2 (two) times daily. Take one capsule every morning and two capsules every night p.o.   Yes [provider]  Ibuprofen-diphenhydrAMINE HCl (ADVIL PM) 200-25 MG CAPS Take 1 capsule by mouth at bedtime.   Yes [provider]  metoprolol tartrate (LOPRESSOR) 25 MG tablet Take 12.5 mg by mouth 2 (two) times daily.   Yes [provider]  PARoxetine (PAXIL) 10 MG tablet Take 10 mg by mouth daily.   Yes [provider]  clindamycin (CLEOCIN) 300 MG capsule Take 1 capsule (300 mg total) by mouth 4 (four) times daily for 7 days. 07/21/20 07/28/20  Shirlee Latch, PA-C    Family History Family History  Problem Relation Age of Onset  . Congestive Heart Failure  Mother   . Diabetes Mother   . Hypertension Mother   . Cancer Father   . Hypertension Father   . Hyperlipidemia Father     Social History Social History   Tobacco Use  . Smoking status: Former Smoker    Quit date: 07/21/2009    Years since quitting: 11.0  . Smokeless tobacco: Never Used  Vaping Use  . Vaping Use: Never used  Substance Use Topics  . Alcohol use: Yes    Comment: social  . Drug use: No     Allergies   Sulfa antibiotics, Amoxicillin, Ciprofloxacin, Dilantin [phenytoin sodium extended], Doxycycline, and Phenobarbital   Review of Systems Review of Systems  Constitutional: Negative for fatigue and fever.  HENT: Positive for facial swelling.     Musculoskeletal: Negative for arthralgias and myalgias.  Skin: Positive for color change and rash. Negative for wound.  Neurological: Negative for weakness.  Hematological: Negative for adenopathy.     Physical Exam Triage Vital Signs ED Triage Vitals  Enc Vitals Group     BP 07/21/20 1249 (!) 144/63     Pulse Rate 07/21/20 1249 (!) 52     Resp 07/21/20 1249 18     Temp 07/21/20 1249 98.4 F (36.9 C)     Temp Source 07/21/20 1249 Oral     SpO2 07/21/20 1249 100 %     Weight 07/21/20 1251 142 lb (64.4 kg)     Height 07/21/20 1251 5' 5.5" (1.664 m)     Head Circumference --      Peak Flow --      Pain Score 07/21/20 1251 7     Pain Loc --      Pain Edu? --      Excl. in GC? --    No data found.  Updated Vital Signs BP (!) 144/63 (BP Location: Left Arm)   Pulse (!) 52 Comment: patient states her HR usually is 40-50  Temp 98.4 F (36.9 C) (Oral)   Resp 18   Ht 5' 5.5" (1.664 m)   Wt 142 lb (64.4 kg)   SpO2 100%   BMI 23.27 kg/m       Physical Exam Vitals and nursing note reviewed.  Constitutional:      General: She is not in acute distress.    Appearance: Normal appearance. She is not ill-appearing or toxic-appearing.  HENT:     Head: Normocephalic and atraumatic.     Nose: Nose normal.     Mouth/Throat:     Mouth: Mucous membranes are moist.     Pharynx: Oropharynx is clear.  Eyes:     General: No scleral icterus.       Right eye: No discharge.        Left eye: No discharge.     Conjunctiva/sclera: Conjunctivae normal.  Cardiovascular:     Rate and Rhythm: Regular rhythm. Bradycardia present.     Heart sounds: Normal heart sounds.  Pulmonary:     Effort: Pulmonary effort is normal. No respiratory distress.     Breath sounds: Normal breath sounds.  Musculoskeletal:     Cervical back: Neck supple.  Skin:    General: Skin is dry.     Comments: 2 cm x 1 cm erythematous nodule of the left chin that is TTP and warm. No fluctuance or drainage   Neurological:     General: No focal deficit present.     Mental Status: She is alert. Mental status is at baseline.  Motor: No weakness.     Gait: Gait normal.  Psychiatric:        Mood and Affect: Mood normal.        Behavior: Behavior normal.        Thought Content: Thought content normal.      UC Treatments / Results  Labs (all labs ordered are listed, but only abnormal results are displayed) Labs Reviewed - No data to display  EKG   Radiology No results found.  Procedures Procedures (including critical care time)  Medications Ordered in UC Medications - No data to display  Initial Impression / Assessment and Plan / UC Course  I have reviewed the triage vital signs and the nursing notes.  Pertinent labs & imaging results that were available during my care of the patient were reviewed by me and considered in my medical decision making (see chart for details).   54 year old female presenting for erythematous nodule of the left side of her chin that she woke up with this morning.  Patient is afebrile.  Exam is consistent with cellulitis of the chin.  Patient showed me photos from the last time this occurred.  She showed me what the chin look like on the first day which is similar to what it looks like today.  She showed me photos 3 days in which showed swelling diffusely of the entire chin as well as underneath her eyes.  Her entire chin was also erythematous.  Since patient states that this condition is similar to episode she has had in the past and she does have a history of MRSA, treating to cover MRSA today with clindamycin.  Patient has allergies with doxycycline and sulfa antibiotics.  Advised her to keep the area clean and dry and begin antibiotics.  ED precautions discussed.  Final Clinical Impressions(s) / UC Diagnoses   Final diagnoses:  Cellulitis of face  History of MRSA infection   Discharge Instructions   None    ED Prescriptions    Medication Sig  Dispense Auth. Provider   clindamycin (CLEOCIN) 300 MG capsule Take 1 capsule (300 mg total) by mouth 4 (four) times daily for 7 days. 28 capsule Shirlee Latch, PA-C     PDMP not reviewed this encounter.   Shirlee Latch, PA-C 07/21/20 1358

## 2020-07-21 NOTE — ED Triage Notes (Signed)
Patient in today c/o red rash on her chest x this morning. Patient has had this before and was told it is a Lupus flare. Patient states in the past her whole face will swell up.

## 2020-09-25 IMAGING — MG DIGITAL SCREENING BILAT W/ TOMO W/ CAD
8 series · 8 of 24 positions shown · non-contrast
Comparison: Previous exam(s).

CLINICAL DATA: Screening.

EXAM:
DIGITAL SCREENING BILATERAL MAMMOGRAM WITH TOMO AND CAD

[R CC synth-2D]
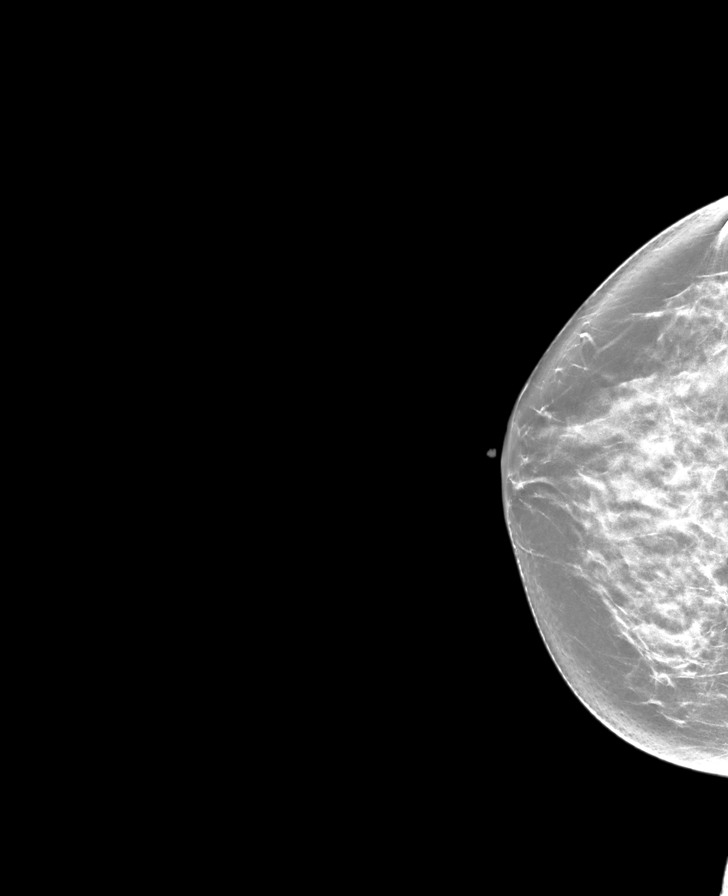

[R MLO synth-2D]
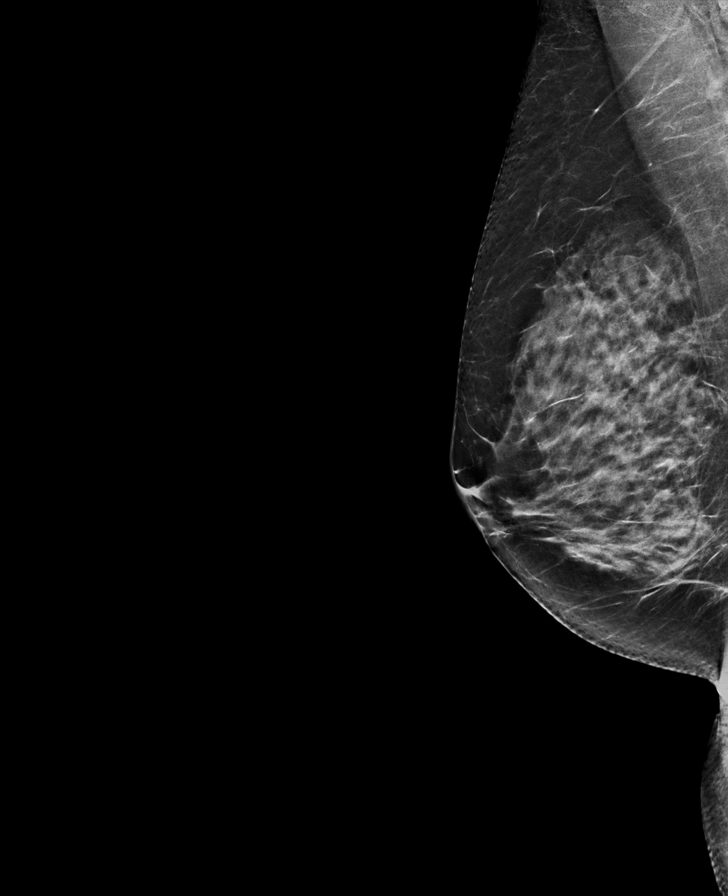

[L CC synth-2D]
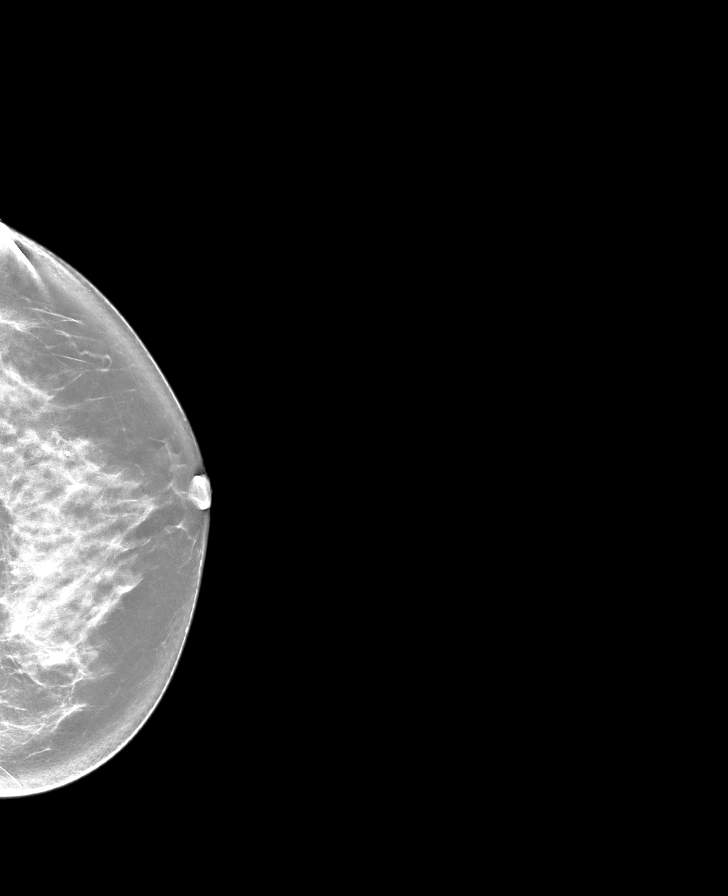

[L MLO synth-2D]
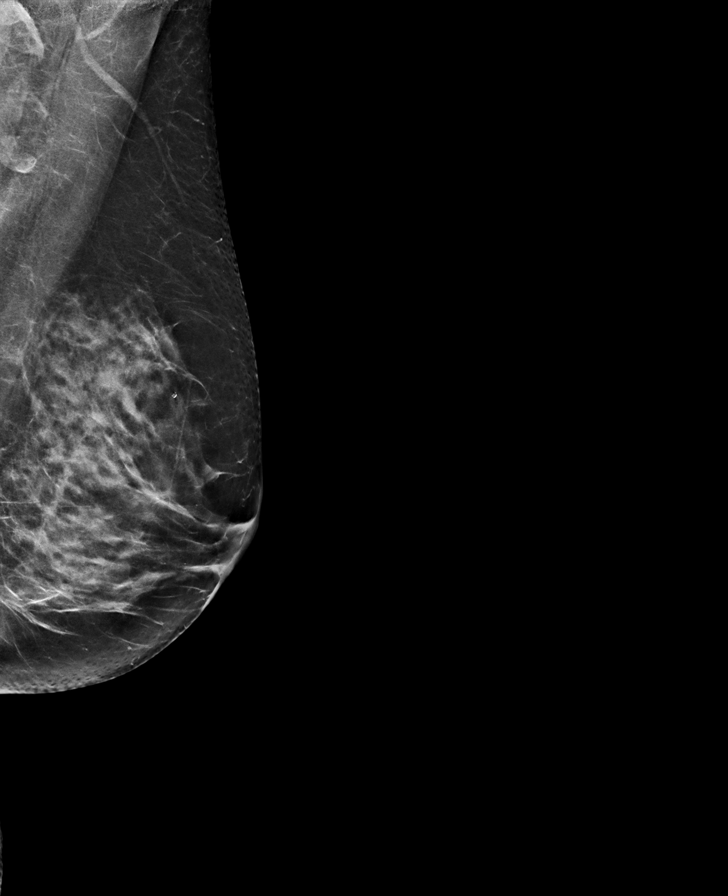

[R MLO tomo · tomo slice 35/69.0]
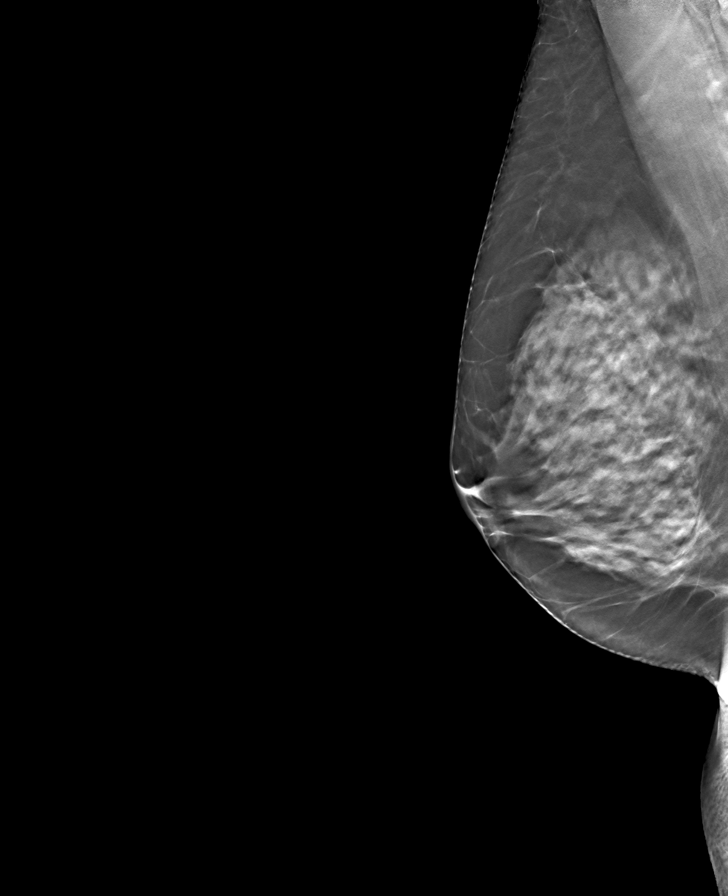

[R CC tomo · tomo slice 37/73.0]
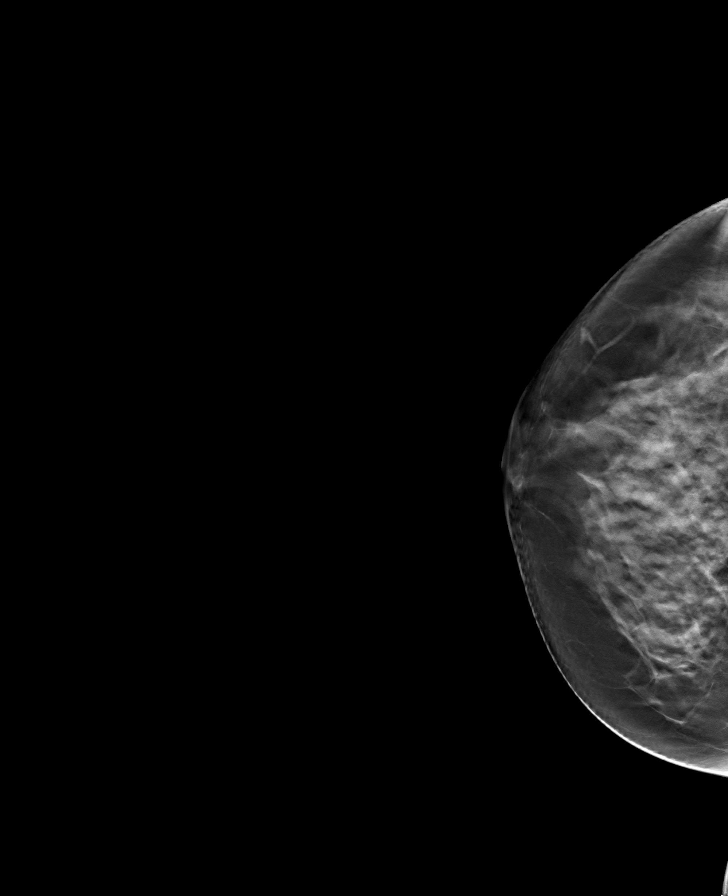

[L MLO tomo · tomo slice 37/72.0]
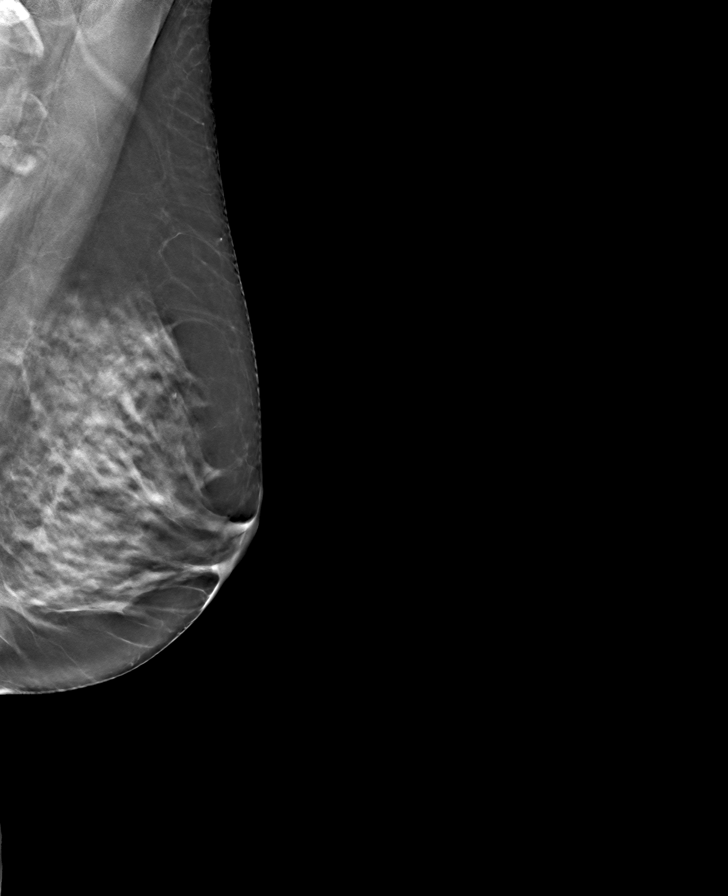

[L CC tomo · tomo slice 39/78.0]
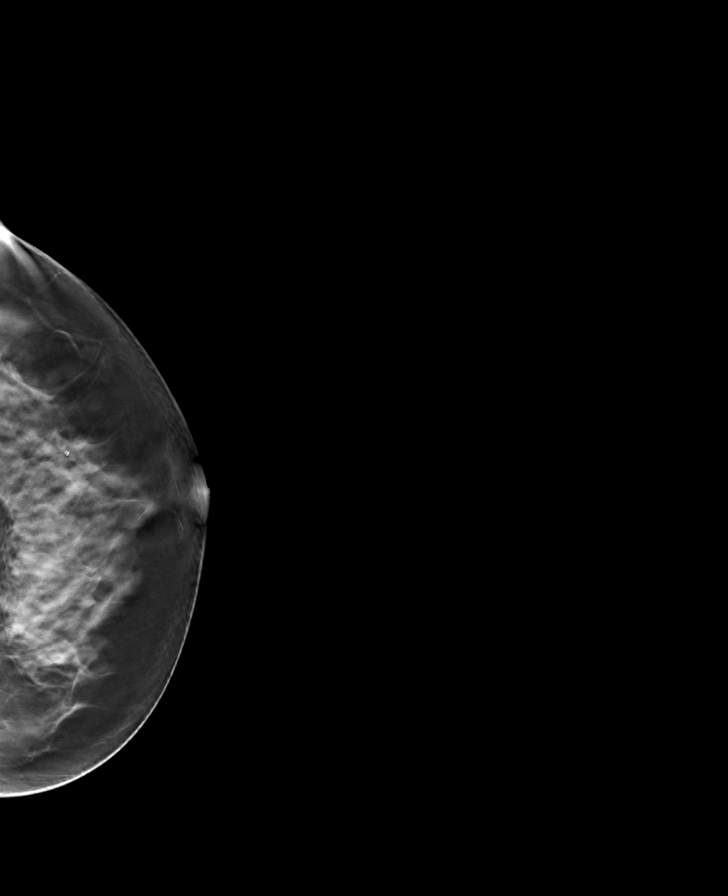

[8 of 24 positions shown; findings below may reference images not displayed]

ACR Breast Density Category c: The breast tissue is heterogeneously
dense, which may obscure small masses.
FINDINGS: There are no findings suspicious for malignancy. Images were
processed with CAD.
IMPRESSION: No mammographic evidence of malignancy. A result letter of this
screening mammogram will be mailed directly to the patient.

RECOMMENDATION:
Screening mammogram in one year. (Code:FT-U-LHB)

BI-RADS CATEGORY  1: Negative.

## 2021-01-21 ENCOUNTER — Other Ambulatory Visit: Payer: Self-pay

## 2021-01-21 ENCOUNTER — Ambulatory Visit
Admission: EM | Admit: 2021-01-21 | Discharge: 2021-01-21 | Disposition: A | Discharge status: Home / Self Care | Payer: Self-pay

## 2021-01-21 DIAGNOSIS — U071 COVID-19: Secondary | ICD-10-CM

## 2021-01-21 MED ORDER — MOLNUPIRAVIR EUA 200MG CAPSULE: 4.0000 | ORAL_CAPSULE | Freq: Two times a day (BID) | ORAL | 0 refills | Status: AC

## 2021-01-21 NOTE — ED Provider Notes (Signed)
[facility]    CSN: [id number] Arrival date & time: 01/21/21  1430      History   Chief Complaint Chief Complaint  Patient presents with  . Covid Positive  . Cough    HPI Rebecca Boone is a 55 y.o. female.   HPI   29 old female here for evaluation of nasal congestion, runny nose, cough, and fatigue.  Patient reports that her symptoms started yesterday.  She took an at home COVID test today and she was positive.  She is here because she wants to talk about treatment options as she has lupus, high blood pressure, and seizures.  Past Medical History:  Diagnosis Date  . Anxiety   . Arthritis   . Discoid lupus   . Hyperlipidemia   . Hypertension   . MRSA (methicillin resistant staph aureus) culture positive   . Neuropathy   . Rosacea   . Seizures (HCC)     There are no problems to display for this patient.   Past Surgical History:  Procedure Laterality Date  . ABDOMINAL HYSTERECTOMY    . COLONOSCOPY WITH PROPOFOL N/A 06/13/2015   Procedure: COLONOSCOPY WITH PROPOFOL;  Surgeon: Christena Deem, MD;  Location: [facility] ENDOSCOPY;  Service: Endoscopy;  Laterality: N/A;  . GANGLION CYST EXCISION      OB History   No obstetric history on file.      Home Medications    Prior to Admission medications   Medication Sig Start Date End Date Taking? Authorizing Provider  clonazePAM (KLONOPIN) 1 MG tablet Take 1 mg by mouth 2 (two) times daily.   Yes [provider]  cyanocobalamin (,VITAMIN B-12,) 1000 MCG/ML injection Inject 1,000 mcg into the muscle once.   Yes [provider]  enalapril (VASOTEC) 20 MG tablet Take 30 mg by mouth daily.   Yes [provider]  ergocalciferol (VITAMIN D2) 50000 UNITS capsule Take 50,000 Units by mouth once a week.   Yes [provider]  estradiol (ESTRACE) 0.5 MG tablet Take 0.5 mg by mouth daily. 04/17/20  Yes [provider]  gabapentin (NEURONTIN) 300 MG capsule Take 300 mg  by mouth 2 (two) times daily. Take one capsule every morning and two capsules every night p.o.   Yes [provider]  Ibuprofen-diphenhydrAMINE HCl (ADVIL PM) 200-25 MG CAPS Take 1 capsule by mouth at bedtime.   Yes [provider]  metoprolol tartrate (LOPRESSOR) 25 MG tablet Take 12.5 mg by mouth 2 (two) times daily.   Yes [provider]  molnupiravir EUA 200 mg CAPS Take 4 capsules (800 mg total) by mouth 2 (two) times daily for 5 days. 01/21/21 01/26/21 Yes Becky Augusta, NP  PARoxetine (PAXIL) 10 MG tablet Take 10 mg by mouth daily.   Yes [provider]  amLODipine (NORVASC) 10 MG tablet Take 1 tablet by mouth daily. 11/20/20   [provider]    Family History Family History  Problem Relation Age of Onset  . Congestive Heart Failure Mother   . Diabetes Mother   . Hypertension Mother   . Cancer Father   . Hypertension Father   . Hyperlipidemia Father     Social History Social History   Tobacco Use  . Smoking status: Former Smoker    Quit date: 07/21/2009    Years since quitting: 11.5  . Smokeless tobacco: Never Used  Vaping Use  . Vaping Use: Never used  Substance Use Topics  . Alcohol use: Yes  Comment: social  . Drug use: No     Allergies   Sulfa antibiotics, Amoxicillin, Ciprofloxacin, Dilantin [phenytoin sodium extended], Doxycycline, and Phenobarbital   Review of Systems Review of Systems  Constitutional: Positive for fatigue. Negative for activity change and appetite change.  HENT: Positive for congestion and rhinorrhea.   Respiratory: Positive for cough.      Physical Exam Triage Vital Signs ED Triage Vitals [01/21/21 1455]  Enc Vitals Group     BP      Pulse      Resp      Temp      Temp src      SpO2      Weight 140 lb (63.5 kg)     Height 5' 5.5" (1.664 m)     Head Circumference      Peak Flow      Pain Score 1     Pain Loc      Pain Edu?      Excl. in GC?    No data found.  Updated Vital  Signs BP 139/64 (BP Location: Left Arm)   Pulse (!) 53   Temp 97.8 F (36.6 C) (Oral)   Resp 14   Ht 5' 5.5" (1.664 m)   Wt 140 lb (63.5 kg)   SpO2 100%   BMI 22.94 kg/m   Visual Acuity Right Eye Distance:   Left Eye Distance:   Bilateral Distance:    Right Eye Near:   Left Eye Near:    Bilateral Near:     Physical Exam Vitals and nursing note reviewed.  Constitutional:      General: She is not in acute distress.    Appearance: Normal appearance. She is normal weight. She is not ill-appearing.  HENT:     Head: Normocephalic and atraumatic.     Right Ear: Tympanic membrane, ear canal and external ear normal. There is no impacted cerumen.     Left Ear: Tympanic membrane, ear canal and external ear normal. There is no impacted cerumen.     Nose: Congestion present.     Mouth/Throat:     Mouth: Mucous membranes are moist.     Pharynx: Oropharynx is clear. No posterior oropharyngeal erythema.  Cardiovascular:     Rate and Rhythm: Normal rate and regular rhythm.     Pulses: Normal pulses.     Heart sounds: Normal heart sounds. No murmur heard. No gallop.   Pulmonary:     Effort: Pulmonary effort is normal.     Breath sounds: Normal breath sounds. No wheezing, rhonchi or rales.  Skin:    General: Skin is warm and dry.     Capillary Refill: Capillary refill takes less than 2 seconds.     Findings: No erythema.  Neurological:     General: No focal deficit present.     Mental Status: She is alert and oriented to person, place, and time.  Psychiatric:        Mood and Affect: Mood normal.        Behavior: Behavior normal.        Thought Content: Thought content normal.        Judgment: Judgment normal.      UC Treatments / Results  Labs (all labs ordered are listed, but only abnormal results are displayed) Labs Reviewed - No data to display  EKG   Radiology No results found.  Procedures Procedures (including critical care time)  Medications Ordered in  UC Medications -  No data to display  Initial Impression / Assessment and Plan / UC Course  I have reviewed the triage vital signs and the nursing notes.  Pertinent labs & imaging results that were available during my care of the patient were reviewed by me and considered in my medical decision making (see chart for details).   Patient is a very pleasant 55 year old female here to discuss treatment options after testing positive for COVID at home today.  Her symptoms began yesterday.  Her symptoms consist of a runny nose with nasal congestion, nonproductive cough, and fatigue.  Patient's physical exam reveals pearly gray tympanic membranes bilaterally with normal light reflex and clear external auditory canals.  Nasal mucosa is erythematous and edematous with scant clear nasal discharge.  Cardiopulmonary dam is benign.  Patient is concerned because she is currently without insurance and does not want to pay for the BMP which we need for a GFR for the Paxlovid.  Will prescribe molnupiravir twice daily for 5 days.  Final Clinical Impressions(s) / UC Diagnoses   Final diagnoses:  COVID-19     Discharge Instructions     Take the molnupiravir twice daily for 5 days for treatment of your COVID-19.  Listed side effects are diarrhea, dizziness, nausea, and skin rash.  Some patients have also reported a metallic taste in the mouth.    ED Prescriptions    Medication Sig Dispense Auth. Provider   molnupiravir EUA 200 mg CAPS Take 4 capsules (800 mg total) by mouth 2 (two) times daily for 5 days. 40 capsule Becky Augusta, NP     PDMP not reviewed this encounter.   Becky Augusta, NP 01/21/21 1520

## 2021-01-21 NOTE — Discharge Instructions (Addendum)
Take the molnupiravir twice daily for 5 days for treatment of your COVID-19.  Listed side effects are diarrhea, dizziness, nausea, and skin rash.  Some patients have also reported a metallic taste in the mouth.

## 2021-01-21 NOTE — ED Triage Notes (Signed)
Patient complains of cough, congestion, fatigue and runny nose that started yesterday. Did a home test today and was positive. States that brother and mother also have covid. States that she has lupus

## 2021-09-07 ENCOUNTER — Ambulatory Visit
Admission: EM | Admit: 2021-09-07 | Discharge: 2021-09-07 | Disposition: A | Discharge status: Home / Self Care | Payer: Self-pay | Attending: Emergency Medicine | Admitting: Emergency Medicine

## 2021-09-07 ENCOUNTER — Other Ambulatory Visit: Payer: Self-pay

## 2021-09-07 DIAGNOSIS — L710 Perioral dermatitis: Secondary | ICD-10-CM

## 2021-09-07 MED ORDER — CLINDAMYCIN HCL 300 MG PO CAPS: 300.0000 mg | ORAL_CAPSULE | Freq: Three times a day (TID) | ORAL | 0 refills | Status: AC

## 2021-09-07 NOTE — ED Triage Notes (Signed)
Pt here wit C/O rash on face, in mouth since Thanksgiving. Pt has Lupus.

## 2021-09-07 NOTE — ED Provider Notes (Signed)
[facility]    CSN: [id number] Arrival date & time: 09/07/21  1034      History   Chief Complaint Chief Complaint  Patient presents with   Rash    HPI Rebecca Boone is a 56 y.o. female.   HPI  36 old female here for evaluation of dermatology complaints.  Patient has a history of lupus and has recurrent facial lesions as a result.  She reports that she has had lesions on the left side of her face since approximately Thanksgiving.  The lesions always appear on the left side of her face.  She has a lesion on Lisette of her nose, left cheek, left chin, and left submental space.  She also has a lesion on her back.  She describes the lesions as burning and itching.  She states that they will develop a clear blister that will rupture and then they will be blood that comes from lesion until it dries up.  She reports that typically when she gets these lesions she needs clindamycin to help them resolve.  She has been on multiple facial ointments over the years without any improvement.  She has tested positive in the past for MRSA.  She currently does not have any insurance and she does not have a PCP, rheumatologist, or dermatologist.  Past Medical History:  Diagnosis Date   Anxiety    Arthritis    Discoid lupus    Hyperlipidemia    Hypertension    MRSA (methicillin resistant staph aureus) culture positive    Neuropathy    Rosacea    Seizures ([place])     There are no problems to display for this patient.   Past Surgical History:  Procedure Laterality Date   ABDOMINAL HYSTERECTOMY     COLONOSCOPY WITH PROPOFOL N/A 06/13/2015   Procedure: COLONOSCOPY WITH PROPOFOL;  Surgeon: Lollie Sails, MD;  [facility] ENDOSCOPY;  Service: Endoscopy;  Laterality: N/A;   GANGLION CYST EXCISION      OB History   No obstetric history on file.      Home Medications    Prior to Admission medications   Medication Sig Start Date End Date Taking? Authorizing Provider   amLODipine (NORVASC) 10 MG tablet Take 1 tablet by mouth daily. 11/20/20  Yes [provider]  clindamycin (CLEOCIN) 300 MG capsule Take 1 capsule (300 mg total) by mouth 3 (three) times daily for 10 days. 09/07/21 09/17/21 Yes Margarette Canada, NP  clonazePAM (KLONOPIN) 1 MG tablet Take 1 mg by mouth 2 (two) times daily.   Yes [provider]  cyanocobalamin (,VITAMIN B-12,) 1000 MCG/ML injection Inject 1,000 mcg into the muscle once.   Yes [provider]  enalapril (VASOTEC) 20 MG tablet Take 30 mg by mouth daily.   Yes [provider]  ergocalciferol (VITAMIN D2) 50000 UNITS capsule Take 50,000 Units by mouth once a week.   Yes [provider]  estradiol (ESTRACE) 0.5 MG tablet Take 0.5 mg by mouth daily. 04/17/20  Yes [provider]  gabapentin (NEURONTIN) 300 MG capsule Take 300 mg by mouth 3 (three) times daily. Take one capsule every morning and two capsules every night p.o.   Yes [provider]  Ibuprofen-diphenhydrAMINE HCl (ADVIL PM) 200-25 MG CAPS Take 1 capsule by mouth at bedtime.   Yes [provider]  metoprolol tartrate (LOPRESSOR) 25 MG tablet Take 12.5 mg by mouth 2 (two) times daily.   Yes [provider]  PARoxetine (PAXIL) 10  MG tablet Take 10 mg by mouth daily.   Yes [provider]    Family History Family History  Problem Relation Age of Onset   Congestive Heart Failure Mother    Diabetes Mother    Hypertension Mother    Cancer Father    Hypertension Father    Hyperlipidemia Father     Social History Social History   Tobacco Use   Smoking status: Former    Types: Cigarettes    Quit date: 07/21/2009    Years since quitting: 12.1   Smokeless tobacco: Never  Vaping Use   Vaping Use: Never used  Substance Use Topics   Alcohol use: Yes    Comment: social   Drug use: No     Allergies   Sulfa antibiotics, Amoxicillin, Ciprofloxacin, Dilantin [phenytoin sodium extended],  Doxycycline, and Phenobarbital   Review of Systems Review of Systems  Constitutional:  Negative for activity change, appetite change and fever.  Skin:  Positive for rash.  Hematological: Negative.   Psychiatric/Behavioral: Negative.      Physical Exam Triage Vital Signs ED Triage Vitals  Enc Vitals Group     BP 09/07/21 1050 134/78     Pulse Rate 09/07/21 1050 60     Resp 09/07/21 1050 18     Temp 09/07/21 1050 97.8 F (36.6 C)     Temp Source 09/07/21 1047 Oral     SpO2 09/07/21 1050 98 %     Weight 09/07/21 1049 145 lb (65.8 kg)     Height 09/07/21 1049 5\' 6"  (1.676 m)     Head Circumference --      Peak Flow --      Pain Score 09/07/21 1049 8     Pain Loc --      Pain Edu? --      Excl. in [place]? --    No data found.  Updated Vital Signs BP 134/78 (BP Location: Left Arm)    Pulse 60    Temp 97.8 F (36.6 C) (Oral)    Resp 18    Ht 5\' 6"  (1.676 m)    Wt 145 lb (65.8 kg)    SpO2 98%    BMI 23.40 kg/m   Visual Acuity Right Eye Distance:   Left Eye Distance:   Bilateral Distance:    Right Eye Near:   Left Eye Near:    Bilateral Near:     Physical Exam Vitals and nursing note reviewed.  Constitutional:      General: She is not in acute distress.    Appearance: Normal appearance. She is normal weight. She is not ill-appearing.  HENT:     Head: Normocephalic and atraumatic.  Skin:    General: Skin is warm and dry.     Capillary Refill: Capillary refill takes less than 2 seconds.     Findings: Erythema and rash present.  Neurological:     General: No focal deficit present.     Mental Status: She is alert and oriented to person, place, and time.  Psychiatric:        Mood and Affect: Mood normal.        Behavior: Behavior normal.        Thought Content: Thought content normal.        Judgment: Judgment normal.     UC Treatments / Results  Labs (all labs ordered are listed, but only abnormal results are displayed) Labs Reviewed - No data to  display  EKG  Radiology No results found.  Procedures Procedures (including critical care time)  Medications Ordered in UC Medications - No data to display  Initial Impression / Assessment and Plan / UC Course  I have reviewed the triage vital signs and the nursing notes.  Pertinent labs & imaging results that were available during my care of the patient were reviewed by me and considered in my medical decision making (see chart for details).  Here for evaluation of skin lesions that are on the left side of her face and also on her left back that been present since Thanksgiving as described in the HPI above.  Patient is wearing significant amount of concealer on her face but you can make out the appearance of raised, erythematous, mildly scaly lesions on the left side of her nose, left cheek, left chin, and left submental space.  No apparent vesicles and no drainage noted.  Patient does have a lesion on the inside of her left lower lip as well that is mildly erythematous without edema, fluctuance, or vesicle formation.  She has a similar lesion on her left back near her scapula that is flat with an erythematous border and a scabbed center.  No vesicular lesions noted and no drainage.  Patient reports that she thinks is coming from her lupus but she is unsure.  She has tested positive for MRSA in the past.  She states that the only thing that resolves the lesions if antibiotic therapy.  She states that clindamycin is the most effective and she typically requires a full 10-day course.  Patient's name is consistent with a dermatitis.  Given the amount of pain she is experiencing they may very well be MRSA lesions.  We will place patient on clindamycin 300 mg 3 times daily for 10 days.   Final Clinical Impressions(s) / UC Diagnoses   Final diagnoses:  POD (perioral dermatitis)     Discharge Instructions      Take the Clindamycin 3 times a day with food for 10 days.  Continue your  current medications for your SLE.  Return for re-evaluation if your symptoms do not improve.  Abstain from using antibacterial soap on your face and use a facial probiotic ( Mrs. Lorrin Goodell) to help regain your facial microbiome balance.     ED Prescriptions     Medication Sig Dispense Auth. Provider   clindamycin (CLEOCIN) 300 MG capsule Take 1 capsule (300 mg total) by mouth 3 (three) times daily for 10 days. 30 capsule Margarette Canada, NP      PDMP not reviewed this encounter.   Margarette Canada, NP 09/07/21 1155

## 2021-09-07 NOTE — Discharge Instructions (Signed)
Take the Clindamycin 3 times a day with food for 10 days.  Continue your current medications for your SLE.  Return for re-evaluation if your symptoms do not improve.  Abstain from using antibacterial soap on your face and use a facial probiotic ( Mrs. Jerilee Hoh) to help regain your facial microbiome balance.

## 2022-02-11 ENCOUNTER — Encounter: Payer: Self-pay | Admitting: Emergency Medicine

## 2022-02-11 ENCOUNTER — Ambulatory Visit
Admission: EM | Admit: 2022-02-11 | Discharge: 2022-02-11 | Disposition: A | Discharge status: Home / Self Care | Payer: Medicaid Other | Attending: Internal Medicine | Admitting: Internal Medicine

## 2022-02-11 DIAGNOSIS — L0100 Impetigo, unspecified: Secondary | ICD-10-CM

## 2022-02-11 MED ORDER — CLINDAMYCIN HCL 300 MG PO CAPS: 300.0000 mg | ORAL_CAPSULE | Freq: Three times a day (TID) | ORAL | 0 refills | Status: DC

## 2022-02-11 MED ORDER — FLUCONAZOLE 150 MG PO TABS: 150.0000 mg | ORAL_TABLET | Freq: Every day | ORAL | 0 refills | Status: DC

## 2022-02-11 NOTE — ED Triage Notes (Signed)
Pt presents with rash on face since Sunday. States went to the Dermatologist last month and was dx with Shingles and placed on valtrex for 30 days. States she has Lupus.

## 2022-02-11 NOTE — ED Provider Notes (Signed)
[facility]    CSN: [id number] Arrival date & time: 02/11/22  1059      History   Chief Complaint Chief Complaint  Patient presents with   Rash    HPI Rebecca Boone is a 56 y.o. female who presents with onset of rash that normally gets with her Lupus last night on her chin, and this am has several spots that are spreading. She had a white head on the skin on top of her upper lip. Those broken areas are tender.     Past Medical History:  Diagnosis Date   Anxiety    Arthritis    Discoid lupus    Hyperlipidemia    Hypertension    MRSA (methicillin resistant staph aureus) culture positive    Neuropathy    Rosacea    Seizures (HCC)     There are no problems to display for this patient.   Past Surgical History:  Procedure Laterality Date   ABDOMINAL HYSTERECTOMY     COLONOSCOPY WITH PROPOFOL N/A 06/13/2015   Procedure: COLONOSCOPY WITH PROPOFOL;  Surgeon: Christena Deem, MD;  Location: [facility] ENDOSCOPY;  Service: Endoscopy;  Laterality: N/A;   GANGLION CYST EXCISION      OB History   No obstetric history on file.      Home Medications    Prior to Admission medications   Medication Sig Start Date End Date Taking? Authorizing Provider  clindamycin (CLEOCIN) 300 MG capsule Take 1 capsule (300 mg total) by mouth 3 (three) times daily. 02/11/22  Yes Rodriguez-Southworth, [name], PA-C  fluconazole (DIFLUCAN) 150 MG tablet Take 1 tablet (150 mg total) by mouth daily. 02/11/22  Yes Rodriguez-Southworth, [name], PA-C  amLODipine (NORVASC) 10 MG tablet Take 1 tablet by mouth daily. 11/20/20   [provider]  clonazePAM (KLONOPIN) 1 MG tablet Take 1 mg by mouth 2 (two) times daily.    [provider]  cyanocobalamin (,VITAMIN B-12,) 1000 MCG/ML injection Inject 1,000 mcg into the muscle once.    [provider]  enalapril (VASOTEC) 20 MG tablet Take 30 mg by mouth daily.    [provider]  ergocalciferol (VITAMIN D2)  50000 UNITS capsule Take 50,000 Units by mouth once a week.    [provider]  estradiol (ESTRACE) 0.5 MG tablet Take 0.5 mg by mouth daily. 04/17/20   [provider]  gabapentin (NEURONTIN) 300 MG capsule Take 300 mg by mouth 3 (three) times daily. Take one capsule every morning and two capsules every night p.o.    [provider]  Ibuprofen-diphenhydrAMINE HCl (ADVIL PM) 200-25 MG CAPS Take 1 capsule by mouth at bedtime.    [provider]  metoprolol tartrate (LOPRESSOR) 25 MG tablet Take 12.5 mg by mouth 2 (two) times daily.    [provider]  PARoxetine (PAXIL) 10 MG tablet Take 10 mg by mouth daily.    [provider]    Family History Family History  Problem Relation Age of Onset   Congestive Heart Failure Mother    Diabetes Mother    Hypertension Mother    Cancer Father    Hypertension Father    Hyperlipidemia Father     Social History Social History   Tobacco Use   Smoking status: Former    Types: Cigarettes    Quit date: 07/21/2009    Years since quitting: 12.5   Smokeless tobacco: Never  Vaping Use   Vaping Use: Never used  Substance Use Topics   Alcohol  use: Yes    Comment: social   Drug use: No     Allergies   Sulfa antibiotics, Amoxicillin, Ciprofloxacin, Dilantin [phenytoin sodium extended], Doxycycline, and Phenobarbital   Review of Systems Review of Systems  Constitutional:  Negative for fever.  Skin:  Positive for rash.     Physical Exam Triage Vital Signs ED Triage Vitals  Enc Vitals Group     BP 02/11/22 1113 134/70     Pulse Rate 02/11/22 1113 (!) 52     Resp 02/11/22 1113 18     Temp 02/11/22 1113 98 F (36.7 C)     Temp Source 02/11/22 1113 Oral     SpO2 02/11/22 1113 99 %     Weight --      Height --      Head Circumference --      Peak Flow --      Pain Score 02/11/22 1111 8     Pain Loc --      Pain Edu? --      Excl. in [place]? --    No data found.  Updated Vital  Signs BP 134/70 (BP Location: Right Arm)   Pulse (!) 52   Temp 98 F (36.7 C) (Oral)   Resp 18   SpO2 99%   Visual Acuity Right Eye Distance:   Left Eye Distance:   Bilateral Distance:    Right Eye Near:   Left Eye Near:    Bilateral Near:     Physical Exam Vitals and nursing note reviewed.  Constitutional:      Appearance: She is normal weight.  Eyes:     General: No scleral icterus.    Conjunctiva/sclera: Conjunctivae normal.  Pulmonary:     Effort: Pulmonary effort is normal.  Musculoskeletal:        General: Normal range of motion.  Skin:    General: Skin is warm and dry.     Findings: Rash present.     Comments: Has erythematous 3 cm x 3 cm not hot patch on her L shin( her usual Lupus rash), and scabbed circular lesions on the R of her mouth and above her upper lip with honey crusting coloration.   Neurological:     Mental Status: She is oriented to person, place, and time.     Gait: Gait normal.  Psychiatric:        Mood and Affect: Mood normal.        Behavior: Behavior normal.        Thought Content: Thought content normal.        Judgment: Judgment normal.      UC Treatments / Results  Labs (all labs ordered are listed, but only abnormal results are displayed) Labs Reviewed - No data to display  EKG   Radiology No results found.  Procedures Procedures (including critical care time)  Medications Ordered in UC Medications - No data to display  Initial Impression / Assessment and Plan / UC Course  I have reviewed the triage vital signs and the nursing notes. Impetigo  I placed her on Clindamycin as noted.      Final Clinical Impressions(s) / UC Diagnoses   Final diagnoses:  Impetigo any site   Discharge Instructions   None    ED Prescriptions     Medication Sig Dispense Auth. Provider   clindamycin (CLEOCIN) 300 MG capsule Take 1 capsule (300 mg total) by mouth 3 (three) times daily. 21 capsule Rodriguez-Southworth, [name], [place]  fluconazole (DIFLUCAN) 150 MG tablet Take 1 tablet (150 mg total) by mouth daily. 2 tablet Rodriguez-Southworth, [name], PA-C      PDMP not reviewed this encounter.   Garey Ham, Cordelia Poche 02/11/22 1129

## 2022-02-17 ENCOUNTER — Ambulatory Visit
Admission: EM | Admit: 2022-02-17 | Discharge: 2022-02-17 | Disposition: A | Discharge status: Home / Self Care | Payer: Medicaid Other | Attending: Emergency Medicine | Admitting: Emergency Medicine

## 2022-02-17 ENCOUNTER — Encounter: Payer: Self-pay | Admitting: Emergency Medicine

## 2022-02-17 DIAGNOSIS — L03211 Cellulitis of face: Secondary | ICD-10-CM

## 2022-02-17 MED ORDER — CLINDAMYCIN HCL 300 MG PO CAPS: 300.0000 mg | ORAL_CAPSULE | Freq: Three times a day (TID) | ORAL | 0 refills | Status: AC

## 2022-02-17 NOTE — ED Triage Notes (Signed)
Pt reports a facial rash. She was recently seen here on 6/15 for same. States she has lupus and was not prescribed enough medication. States she was only prescribed 21 tablets and is normally prescribed 30 tablets of Clindamycin 300 mg.

## 2022-02-17 NOTE — Discharge Instructions (Signed)
Take the Clindamycin three times daily with food for 7 days.  Apply warm compresses to help promote drainage.  Use OTC Tylenol and Ibuprofen according to the package instructions as needed for pain.  Take Probiotics while on antibiotics.  If your symptoms do not resolve you need to follow-up with dermatology.

## 2022-02-17 NOTE — ED Provider Notes (Signed)
[facility]    CSN: [id number] Arrival date & time: 02/17/22  1342      History   Chief Complaint Chief Complaint  Patient presents with   Rash    HPI THIA OLESEN is a 56 y.o. female.   HPI  56 year old female here for evaluation of facial lesion.  Patient was evaluated in this urgent care on 02/11/2022 and diagnosed with impetigo.  She was treated with a 7-day course of clindamycin and reports that all of her facial lesions have not resolved.  The circumoral lesions have resolved but she still has a single lesion on the left side of her chin which is swollen every morning and is draining a clear fluid.  She denies any fever.  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  Past Medical History:  Diagnosis Date   Anxiety    Arthritis    Discoid lupus    Hyperlipidemia    Hypertension    MRSA (methicillin resistant staph aureus) culture positive    Neuropathy    Rosacea    Seizures (HCC)     There are no problems to display for this patient.   Past Surgical History:  Procedure Laterality Date   ABDOMINAL HYSTERECTOMY     COLONOSCOPY WITH PROPOFOL N/A 06/13/2015   Procedure: COLONOSCOPY WITH PROPOFOL;  Surgeon: Christena Deem, MD;  Location: [facility] ENDOSCOPY;  Service: Endoscopy;  Laterality: N/A;   GANGLION CYST EXCISION      OB History   No obstetric history on file.      Home Medications    Prior to Admission medications   Medication Sig Start Date End Date Taking? Authorizing Provider  amLODipine (NORVASC) 10 MG tablet Take 1 tablet by mouth daily. 11/20/20   [provider]  clindamycin (CLEOCIN) 300 MG capsule Take 1 capsule (300 mg total) by mouth 3 (three) times daily for 7 days. 02/17/22 02/24/22  Becky Augusta, NP  clonazePAM (KLONOPIN) 1 MG tablet Take 1 mg by mouth 2 (two) times daily.    [provider]  cyanocobalamin (,VITAMIN B-12,) 1000 MCG/ML injection Inject 1,000 mcg into the  muscle once.    [provider]  enalapril (VASOTEC) 20 MG tablet Take 30 mg by mouth daily.    [provider]  ergocalciferol (VITAMIN D2) 50000 UNITS capsule Take 50,000 Units by mouth once a week.    [provider]  estradiol (ESTRACE) 0.5 MG tablet Take 0.5 mg by mouth daily. 04/17/20   [provider]  fluconazole (DIFLUCAN) 150 MG tablet Take 1 tablet (150 mg total) by mouth daily. 02/11/22   Rodriguez-Southworth, [name], PA-C  gabapentin (NEURONTIN) 300 MG capsule Take 300 mg by mouth 3 (three) times daily. Take one capsule every morning and two capsules every night p.o.    [provider]  Ibuprofen-diphenhydrAMINE HCl (ADVIL PM) 200-25 MG CAPS Take 1 capsule by mouth at bedtime.    [provider]  metoprolol tartrate (LOPRESSOR) 25 MG tablet Take 12.5 mg by mouth 2 (two) times daily.    [provider]  PARoxetine (PAXIL) 10 MG tablet Take 10 mg by mouth daily.    [provider]    Family History Family History  Problem Relation Age of Onset   Congestive Heart Failure Mother    Diabetes Mother    Hypertension Mother    Cancer Father    Hypertension Father    Hyperlipidemia Father  Social History Social History   Tobacco Use   Smoking status: Former    Types: Cigarettes    Quit date: 07/21/2009    Years since quitting: 12.5   Smokeless tobacco: Never  Vaping Use   Vaping Use: Never used  Substance Use Topics   Alcohol use: Yes    Comment: social   Drug use: No     Allergies   Sulfa antibiotics, Amoxicillin, Ciprofloxacin, Dilantin [phenytoin sodium extended], Doxycycline, and Phenobarbital   Review of Systems Review of Systems  Constitutional:  Negative for fever.  Skin:  Positive for color change and rash.  Hematological: Negative.   Psychiatric/Behavioral: Negative.       Physical Exam Triage Vital Signs ED Triage Vitals  Enc Vitals Group     BP --      Pulse --      Resp  --      Temp --      Temp src --      SpO2 02/17/22 1356 99 %     Weight --      Height --      Head Circumference --      Peak Flow --      Pain Score 02/17/22 1355 10     Pain Loc --      Pain Edu? --      Excl. in GC? --    No data found.  Updated Vital Signs BP (!) 152/65 (BP Location: Right Arm)   Pulse (!) 46   Temp 97.7 F (36.5 C) (Oral)   Resp 17   SpO2 99%   Visual Acuity Right Eye Distance:   Left Eye Distance:   Bilateral Distance:    Right Eye Near:   Left Eye Near:    Bilateral Near:     Physical Exam Vitals and nursing note reviewed.  Constitutional:      Appearance: Normal appearance. She is not ill-appearing.  HENT:     Head: Normocephalic and atraumatic.  Skin:    General: Skin is warm and dry.     Capillary Refill: Capillary refill takes less than 2 seconds.     Findings: Erythema and lesion present.  Neurological:     General: No focal deficit present.     Mental Status: She is alert and oriented to person, place, and time.  Psychiatric:        Mood and Affect: Mood normal.        Behavior: Behavior normal.        Thought Content: Thought content normal.        Judgment: Judgment normal.      UC Treatments / Results  Labs (all labs ordered are listed, but only abnormal results are displayed) Labs Reviewed - No data to display  EKG   Radiology No results found.  Procedures Procedures (including critical care time)  Medications Ordered in UC Medications - No data to display  Initial Impression / Assessment and Plan / UC Course  I have reviewed the triage vital signs and the nursing notes.  Pertinent labs & imaging results that were available during my care of the patient were reviewed by me and considered in my medical decision making (see chart for details).  Patient is a nontoxic-appearing 56 year old female here for evaluation of lesion on her left shin that has been present for over a week and did not resolve following  a 7-day round of clindamycin.  She does have a history of discoid  lupus and reports that she gets many facial infections.  On exam patient has a lesion that is approximately size of a quarter on the left side of her chin which is erythematous, edematous, and indurated.  No fluctuance noted.  She was previously treated for impetigo but this does not look like her standard impetigo lesion as there is no honey crust.  It does resemble facial cellulitis.  She has significant allergies to multiple antibiotics so I will redose her with another 7-day course of clindamycin.  I have advised her that if her symptoms do not improve with the second round of antibiotics that she needs to see dermatology.   Final Clinical Impressions(s) / UC Diagnoses   Final diagnoses:  Cellulitis of face     Discharge Instructions      Take the Clindamycin three times daily with food for 7 days.  Apply warm compresses to help promote drainage.  Use OTC Tylenol and Ibuprofen according to the package instructions as needed for pain.  Take Probiotics while on antibiotics.  If your symptoms do not resolve you need to follow-up with dermatology.       ED Prescriptions     Medication Sig Dispense Auth. Provider   clindamycin (CLEOCIN) 300 MG capsule Take 1 capsule (300 mg total) by mouth 3 (three) times daily for 7 days. 21 capsule Becky Augusta, NP      PDMP not reviewed this encounter.   Becky Augusta, NP 02/17/22 1413

## 2022-02-26 DIAGNOSIS — M359 Systemic involvement of connective tissue, unspecified: Secondary | ICD-10-CM | POA: Diagnosis present

## 2022-07-09 ENCOUNTER — Encounter: Payer: Self-pay | Admitting: Emergency Medicine

## 2022-07-09 ENCOUNTER — Ambulatory Visit
Admission: EM | Admit: 2022-07-09 | Discharge: 2022-07-09 | Disposition: A | Discharge status: Home / Self Care | Payer: Medicaid Other

## 2022-07-09 DIAGNOSIS — U071 COVID-19: Secondary | ICD-10-CM

## 2022-07-09 MED ORDER — PROMETHAZINE-DM 6.25-15 MG/5ML PO SYRP: 5.0000 mL | ORAL_SOLUTION | Freq: Four times a day (QID) | ORAL | 0 refills | Status: DC | PRN

## 2022-07-09 MED ORDER — BENZONATATE 100 MG PO CAPS: 100.0000 mg | ORAL_CAPSULE | Freq: Three times a day (TID) | ORAL | 0 refills | Status: DC

## 2022-07-09 MED ORDER — MOLNUPIRAVIR EUA 200MG CAPSULE: 4.0000 | ORAL_CAPSULE | Freq: Two times a day (BID) | ORAL | 0 refills | Status: AC

## 2022-07-09 NOTE — ED Triage Notes (Signed)
Pt c/o subjective fever, headache, body aches, cough, nasal congestion, post nasal drip. Started yesterday. She took a home test this morning and was positive.

## 2022-07-09 NOTE — Discharge Instructions (Signed)
please continue to quarantine for at least 5 days from your symptom onset , if symptoms are still present after 5 days please wear masks while completing activities.  Return to normal activity on Tuesday  Begin use of antiviral every morning and every evening for 5 days, ideally this will reduce the amount of virus within your body which will lessen your symptoms and timeline that you are ill does not fully take away illness  You may use Tessalon pill every 8 hours as needed to help with your coughing  You may use cough syrup every 6 hours as needed for additional comfort, be mindful of this medication may make you feel drowsy    You can take Tylenol and/or Ibuprofen as needed for fever reduction and pain relief.   For cough: honey 1/2 to 1 teaspoon (you can dilute the honey in water or another fluid).  You can also use guaifenesin and dextromethorphan for cough. You can use a humidifier for chest congestion and cough.  If you don't have a humidifier, you can sit in the bathroom with the hot shower running.      For sore throat: try warm salt water gargles, cepacol lozenges, throat spray, warm tea or water with lemon/honey, popsicles or ice, or OTC cold relief medicine for throat discomfort.   For congestion: take a daily anti-histamine like Zyrtec, Claritin, and a oral decongestant, such as pseudoephedrine.  You can also use Flonase 1-2 sprays in each nostril daily.   It is important to stay hydrated: drink plenty of fluids (water, gatorade/powerade/pedialyte, juices, or teas) to keep your throat moisturized and help further relieve irritation/discomfort.

## 2022-07-09 NOTE — ED Provider Notes (Signed)
[facility]    CSN: [id number] Arrival date & time: 07/09/22  0160      History   Chief Complaint Chief Complaint  Patient presents with   Covid Positive    HPI Rebecca Boone is a 56 y.o. female.   Patient presents with subjective fever, chills, body aches, nasal congestion, rhinorrhea, postnasal drip, sore throat, right-sided ear pain, nonproductive cough and generalized headache beginning 1 day ago.  Home COVID test positive this morning, known exposure to COVID within household.  Tolerating food and liquids.  Has attempted use of ibuprofen 800 mg and Alka-Seltzer cold and flu.  History of hypertension, rheumatoid arthritis, lupus, hyperlipidemia and seizures.  Shortness of breath, wheezing.  Past Medical History:  Diagnosis Date   Anxiety    Arthritis    Discoid lupus    Hyperlipidemia    Hypertension    MRSA (methicillin resistant staph aureus) culture positive    Neuropathy    Rosacea    Seizures (HCC)     There are no problems to display for this patient.   Past Surgical History:  Procedure Laterality Date   ABDOMINAL HYSTERECTOMY     COLONOSCOPY WITH PROPOFOL N/A 06/13/2015   Procedure: COLONOSCOPY WITH PROPOFOL;  Surgeon: Christena Deem, MD;  Location: [facility] ENDOSCOPY;  Service: Endoscopy;  Laterality: N/A;   GANGLION CYST EXCISION      OB History   No obstetric history on file.      Home Medications    Prior to Admission medications   Medication Sig Start Date End Date Taking? Authorizing Provider  amLODipine (NORVASC) 10 MG tablet Take 1 tablet by mouth daily. 11/20/20  Yes [provider]  cephALEXin (KEFLEX) 250 MG capsule Take 250 mg by mouth 2 (two) times daily. 06/30/22  Yes [provider]  clonazePAM (KLONOPIN) 1 MG tablet Take 1 mg by mouth 2 (two) times daily.   Yes [provider]  cyanocobalamin (,VITAMIN B-12,) 1000 MCG/ML injection Inject 1,000 mcg into the muscle once.   Yes [provider]  enalapril (VASOTEC) 20 MG tablet Take 30 mg by mouth daily.   Yes [provider]  ergocalciferol (VITAMIN D2) 50000 UNITS capsule Take 50,000 Units by mouth once a week.   Yes [provider]  estradiol (ESTRACE) 0.5 MG tablet Take 0.5 mg by mouth daily. 04/17/20  Yes [provider]  gabapentin (NEURONTIN) 300 MG capsule Take 300 mg by mouth 3 (three) times daily. Take one capsule every morning and two capsules every night p.o.   Yes [provider]  metoprolol tartrate (LOPRESSOR) 25 MG tablet Take 12.5 mg by mouth 2 (two) times daily.   Yes [provider]  fluconazole (DIFLUCAN) 150 MG tablet Take 1 tablet (150 mg total) by mouth daily. 02/11/22   Rodriguez-Southworth, [name], PA-C  Ibuprofen-diphenhydrAMINE HCl (ADVIL PM) 200-25 MG CAPS Take 1 capsule by mouth at bedtime.    [provider]  PARoxetine (PAXIL) 10 MG tablet Take 10 mg by mouth daily.    [provider]    Family History Family History  Problem Relation Age of Onset   Congestive Heart Failure Mother    Diabetes Mother    Hypertension Mother    Cancer Father    Hypertension Father    Hyperlipidemia Father     Social History Social History   Tobacco Use   Smoking status: Former    Types: Cigarettes    Quit date: 07/21/2009  Years since quitting: 12.9   Smokeless tobacco: Never  Vaping Use   Vaping Use: Never used  Substance Use Topics   Alcohol use: Yes    Comment: social   Drug use: No     Allergies   Sulfa antibiotics, Amoxicillin, Ciprofloxacin, Dilantin [phenytoin sodium extended], Doxycycline, and Phenobarbital   Review of Systems Review of Systems  Constitutional:  Positive for chills and fever. Negative for activity change, appetite change, diaphoresis, fatigue and unexpected weight change.  HENT:  Positive for congestion, ear pain, postnasal drip, rhinorrhea and sore throat. Negative for dental problem, drooling,  ear discharge, facial swelling, hearing loss, mouth sores, nosebleeds, sinus pressure, sinus pain, sneezing, tinnitus, trouble swallowing and voice change.   Respiratory:  Positive for cough. Negative for apnea, choking, chest tightness, shortness of breath, wheezing and stridor.   Skin: Negative.   Neurological:  Positive for headaches. Negative for dizziness, tremors, seizures, syncope, facial asymmetry, speech difficulty, weakness, light-headedness and numbness.  All other systems reviewed and are negative.    Physical Exam Triage Vital Signs ED Triage Vitals  Enc Vitals Group     BP 07/09/22 1016 128/72     Pulse Rate 07/09/22 1016 65     Resp 07/09/22 1016 16     Temp 07/09/22 1016 98.5 F (36.9 C)     Temp Source 07/09/22 1016 Oral     SpO2 07/09/22 1016 96 %     Weight 07/09/22 1014 145 lb 1 oz (65.8 kg)     Height 07/09/22 1014 5\' 6"  (1.676 m)     Head Circumference --      Peak Flow --      Pain Score 07/09/22 1013 7     Pain Loc --      Pain Edu? --      Excl. in GC? --    No data found.  Updated Vital Signs BP 128/72 (BP Location: Right Arm)   Pulse 65   Temp 98.5 F (36.9 C) (Oral)   Resp 16   Ht 5\' 6"  (1.676 m)   Wt 145 lb 1 oz (65.8 kg)   SpO2 96%   BMI 23.41 kg/m   Visual Acuity Right Eye Distance:   Left Eye Distance:   Bilateral Distance:    Right Eye Near:   Left Eye Near:    Bilateral Near:     Physical Exam Constitutional:      Appearance: Normal appearance.  HENT:     Head: Normocephalic.     Right Ear: Tympanic membrane, ear canal and external ear normal.     Left Ear: Tympanic membrane, ear canal and external ear normal.     Nose: Congestion and rhinorrhea present.     Mouth/Throat:     Mouth: Mucous membranes are moist.     Pharynx: Posterior oropharyngeal erythema present.  Eyes:     Extraocular Movements: Extraocular movements intact.  Cardiovascular:     Rate and Rhythm: Normal rate and regular rhythm.     Pulses: Normal  pulses.     Heart sounds: Normal heart sounds.  Pulmonary:     Effort: Pulmonary effort is normal.     Breath sounds: Normal breath sounds.  Musculoskeletal:     Cervical back: Normal range of motion and neck supple.  Skin:    General: Skin is warm and dry.  Neurological:     Mental Status: She is alert and oriented to person, place, and time. Mental status is at baseline.  Psychiatric:        Mood and Affect: Mood normal.        Behavior: Behavior normal.      UC Treatments / Results  Labs (all labs ordered are listed, but only abnormal results are displayed) Labs Reviewed - No data to display  EKG   Radiology No results found.  Procedures Procedures (including critical care time)  Medications Ordered in UC Medications - No data to display  Initial Impression / Assessment and Plan / UC Course  I have reviewed the triage vital signs and the nursing notes.  Pertinent labs & imaging results that were available during my care of the patient were reviewed by me and considered in my medical decision making (see chart for details).  COVID-19  Vital signs are stable patient is in no signs of distress nontoxic-appearing, home testing positive for known exposure within the household therefore will not repeat in office, discussed quarantine guidelines per CDC protocol he may return activity on Tuesday, antiviral prescribed due to history as well as Tessalon and Promethazine DM as cough is most worrisome symptom, may use additional over-the-counter medications as needed with follow-up with urgent care as needed Final Clinical Impressions(s) / UC Diagnoses   Final diagnoses:  None   Discharge Instructions   None    ED Prescriptions   None    PDMP not reviewed this encounter.   Valinda Hoar, NP 07/09/22 1043

## 2022-11-24 ENCOUNTER — Other Ambulatory Visit: Payer: Self-pay | Admitting: Family Medicine

## 2022-11-24 DIAGNOSIS — Z1231 Encounter for screening mammogram for malignant neoplasm of breast: Secondary | ICD-10-CM

## 2022-12-06 ENCOUNTER — Ambulatory Visit: Payer: Medicaid Other

## 2022-12-19 ENCOUNTER — Encounter: Payer: Self-pay | Admitting: Emergency Medicine

## 2022-12-19 ENCOUNTER — Ambulatory Visit
Admission: EM | Admit: 2022-12-19 | Discharge: 2022-12-19 | Disposition: A | Discharge status: Home / Self Care | Payer: Medicaid Other | Attending: Nurse Practitioner | Admitting: Nurse Practitioner

## 2022-12-19 DIAGNOSIS — Z1152 Encounter for screening for COVID-19: Secondary | ICD-10-CM | POA: Diagnosis not present

## 2022-12-19 DIAGNOSIS — J069 Acute upper respiratory infection, unspecified: Secondary | ICD-10-CM | POA: Diagnosis present

## 2022-12-19 DIAGNOSIS — R051 Acute cough: Secondary | ICD-10-CM

## 2022-12-19 LAB — SARS CORONAVIRUS 2 BY RT PCR: SARS Coronavirus 2 by RT PCR: NEGATIVE

## 2022-12-19 MED ORDER — BENZONATATE 200 MG PO CAPS: 200.0000 mg | ORAL_CAPSULE | Freq: Three times a day (TID) | ORAL | 0 refills | Status: DC | PRN

## 2022-12-19 NOTE — Discharge Instructions (Signed)
Your COVID test was negative. Please treat your symptoms with over the counter tylenol or ibuprofen, humidifier, and rest.  Tessalon as needed for cough.  Viral illnesses can last 7-14 days. Please follow up with your PCP if your symptoms are not improving. Please go to the ER for any worsening symptoms. This includes but is not limited to fever you can not control with tylenol or ibuprofen, you are not able to stay hydrated, you have shortness of breath or chest pain.  Thank you for choosing [facility] for your healthcare needs. I hope you feel better soon!

## 2022-12-19 NOTE — ED Triage Notes (Signed)
Patient c/o cough, congestion, and runny nose that started 4 days ago.  Patient denies fevers.

## 2022-12-19 NOTE — ED Provider Notes (Signed)
[facility]    CSN: [id number] Arrival date & time: 12/19/22  1219      History   Chief Complaint Chief Complaint  Patient presents with   Cough    HPI Rebecca Boone is a 57 y.o. female  presents for evaluation of URI symptoms for 4 days. Patient reports associated symptoms of cough, congestion, runny nose. Denies N/V/D, fevers, ear pain, sore throat, body aches, shortness of breath. Patient does not have a hx of asthma or smoking.  Brother-in-law and sister have had similar symptoms.  Her mother is terminally ill and states she has been under a lot of stress and has been wearing her down.  Pt has taken Alka-Seltzer OTC for symptoms. Pt has no other concerns at this time.    Cough Associated symptoms: rhinorrhea     Past Medical History:  Diagnosis Date   Anxiety    Arthritis    Discoid lupus    Hyperlipidemia    Hypertension    MRSA (methicillin resistant staph aureus) culture positive    Neuropathy    Rosacea    Seizures     There are no problems to display for this patient.   Past Surgical History:  Procedure Laterality Date   ABDOMINAL HYSTERECTOMY     COLONOSCOPY WITH PROPOFOL N/A 06/13/2015   Procedure: COLONOSCOPY WITH PROPOFOL;  Surgeon: Christena Deem, MD;  Location: [facility] ENDOSCOPY;  Service: Endoscopy;  Laterality: N/A;   GANGLION CYST EXCISION      OB History   No obstetric history on file.      Home Medications    Prior to Admission medications   Medication Sig Start Date End Date Taking? Authorizing Provider  benzonatate (TESSALON) 200 MG capsule Take 1 capsule (200 mg total) by mouth 3 (three) times daily as needed for cough. 12/19/22  Yes Radford Pax, NP  spironolactone (ALDACTONE) 25 MG tablet Take 1 tablet by mouth daily. 11/15/22 11/15/23 Yes [provider]  amLODipine (NORVASC) 10 MG tablet Take 1 tablet by mouth daily. 11/20/20   [provider]  cephALEXin (KEFLEX) 250 MG capsule Take 250 mg by  mouth 2 (two) times daily. 06/30/22   [provider]  clonazePAM (KLONOPIN) 1 MG tablet Take 1 mg by mouth 2 (two) times daily.    [provider]  cyanocobalamin (,VITAMIN B-12,) 1000 MCG/ML injection Inject 1,000 mcg into the muscle once.    [provider]  enalapril (VASOTEC) 20 MG tablet Take 30 mg by mouth daily.    [provider]  ergocalciferol (VITAMIN D2) 50000 UNITS capsule Take 50,000 Units by mouth once a week.    [provider]  estradiol (ESTRACE) 0.5 MG tablet Take 0.5 mg by mouth daily. 04/17/20   [provider]  fluconazole (DIFLUCAN) 150 MG tablet Take 1 tablet (150 mg total) by mouth daily. 02/11/22   Rodriguez-Southworth, [name], PA-C  FLUoxetine (PROZAC) 10 MG capsule Take by mouth.    [provider]  gabapentin (NEURONTIN) 300 MG capsule Take 300 mg by mouth 3 (three) times daily. Take one capsule every morning and two capsules every night p.o.    [provider]  Ibuprofen-diphenhydrAMINE HCl (ADVIL PM) 200-25 MG CAPS Take 1 capsule by mouth at bedtime.    [provider]  metoprolol tartrate (LOPRESSOR) 25 MG tablet Take 12.5 mg by mouth 2 (two) times daily.    [provider]  PARoxetine (PAXIL) 10 MG tablet Take 10 mg by mouth  daily.    [provider]  promethazine-dextromethorphan (PROMETHAZINE-DM) 6.25-15 MG/5ML syrup Take 5 mLs by mouth 4 (four) times daily as needed for cough. 07/09/22   Valinda Hoar, NP    Family History Family History  Problem Relation Age of Onset   Congestive Heart Failure Mother    Diabetes Mother    Hypertension Mother    Cancer Father    Hypertension Father    Hyperlipidemia Father     Social History Social History   Tobacco Use   Smoking status: Former    Types: Cigarettes    Quit date: 07/21/2009    Years since quitting: 13.4   Smokeless tobacco: Never  Vaping Use   Vaping Use: Never used  Substance Use Topics    Alcohol use: Yes    Comment: social   Drug use: No     Allergies   Sulfa antibiotics, Amoxicillin, Ciprofloxacin, Dilantin [phenytoin sodium extended], Doxycycline, and Phenobarbital   Review of Systems Review of Systems  HENT:  Positive for congestion and rhinorrhea.   Respiratory:  Positive for cough.      Physical Exam Triage Vital Signs ED Triage Vitals  Enc Vitals Group     BP 12/19/22 1232 (!) 105/59     Pulse Rate 12/19/22 1232 (!) 55     Resp 12/19/22 1232 14     Temp 12/19/22 1232 98.4 F (36.9 C)     Temp Source 12/19/22 1232 Oral     SpO2 12/19/22 1232 94 %     Weight 12/19/22 1230 138 lb (62.6 kg)     Height 12/19/22 1230  (1.676 m)     Head Circumference --      Peak Flow --      Pain Score 12/19/22 1230 0     Pain Loc --      Pain Edu? --      Excl. in GC? --    No data found.  Updated Vital Signs BP (!) 105/59 (BP Location: Left Arm)   Pulse (!) 55   Temp 98.4 F (36.9 C) (Oral)   Resp 14   Ht  (1.676 m)   Wt 138 lb (62.6 kg)   SpO2 94%   BMI 22.27 kg/m   Visual Acuity Right Eye Distance:   Left Eye Distance:   Bilateral Distance:    Right Eye Near:   Left Eye Near:    Bilateral Near:     Physical Exam Vitals and nursing note reviewed.  Constitutional:      General: She is not in acute distress.    Appearance: She is well-developed. She is not ill-appearing.  HENT:     Head: Normocephalic and atraumatic.     Right Ear: Tympanic membrane and ear canal normal.     Left Ear: Tympanic membrane and ear canal normal.     Nose: Congestion present.     Mouth/Throat:     Mouth: Mucous membranes are moist.     Pharynx: Oropharynx is clear. Uvula midline. No oropharyngeal exudate or posterior oropharyngeal erythema.     Tonsils: No tonsillar exudate or tonsillar abscesses.  Eyes:     Conjunctiva/sclera: Conjunctivae normal.     Pupils: Pupils are equal, round, and reactive to light.  Cardiovascular:     Rate and Rhythm:  Normal rate and regular rhythm.     Heart sounds: Normal heart sounds.  Pulmonary:     Effort: Pulmonary effort is normal.  Breath sounds: Normal breath sounds.  Musculoskeletal:     Cervical back: Normal range of motion and neck supple.  Lymphadenopathy:     Cervical: No cervical adenopathy.  Skin:    General: Skin is warm and dry.  Neurological:     General: No focal deficit present.     Mental Status: She is alert and oriented to person, place, and time.  Psychiatric:        Mood and Affect: Mood normal.        Behavior: Behavior normal.      UC Treatments / Results  Labs (all labs ordered are listed, but only abnormal results are displayed) Labs Reviewed  SARS CORONAVIRUS 2 BY RT PCR    EKG   Radiology No results found.  Procedures Procedures (including critical care time)  Medications Ordered in UC Medications - No data to display  Initial Impression / Assessment and Plan / UC Course  I have reviewed the triage vital signs and the nursing notes.  Pertinent labs & imaging results that were available during my care of the patient were reviewed by me and considered in my medical decision making (see chart for details).     COVID PCR.  Reviewed exam and symptoms.  Discussed viral illness and symptomatic treatment Tessalon as needed for cough PCP follow-up if symptoms do not improve ER precautions reviewed and patient verbalized understanding Final Clinical Impressions(s) / UC Diagnoses   Final diagnoses:  Acute cough  Acute upper respiratory infection     Discharge Instructions      Your COVID test was negative. Please treat your symptoms with over the counter tylenol or ibuprofen, humidifier, and rest.  Tessalon as needed for cough.  Viral illnesses can last 7-14 days. Please follow up with your PCP if your symptoms are not improving. Please go to the ER for any worsening symptoms. This includes but is not limited to fever you can not control with  tylenol or ibuprofen, you are not able to stay hydrated, you have shortness of breath or chest pain.  Thank you for choosing [facility] for your healthcare needs. I hope you feel better soon!      ED Prescriptions     Medication Sig Dispense Auth. Provider   benzonatate (TESSALON) 200 MG capsule Take 1 capsule (200 mg total) by mouth 3 (three) times daily as needed for cough. 20 capsule Radford Pax, NP      PDMP not reviewed this encounter.   Radford Pax, NP 12/19/22 1400

## 2022-12-28 ENCOUNTER — Ambulatory Visit: Payer: Self-pay

## 2023-01-04 ENCOUNTER — Ambulatory Visit
Admission: RE | Admit: 2023-01-04 | Discharge: 2023-01-04 | Disposition: A | Discharge status: Home / Self Care | Payer: Medicaid Other | Source: Ambulatory Visit | Attending: Family Medicine | Admitting: Family Medicine

## 2023-01-04 DIAGNOSIS — Z1231 Encounter for screening mammogram for malignant neoplasm of breast: Secondary | ICD-10-CM | POA: Insufficient documentation

## 2023-06-28 ENCOUNTER — Telehealth: Payer: Self-pay

## 2023-06-28 ENCOUNTER — Ambulatory Visit
Admission: EM | Admit: 2023-06-28 | Discharge: 2023-06-28 | Disposition: A | Discharge status: Home / Self Care | Payer: Medicaid Other

## 2023-06-28 DIAGNOSIS — R051 Acute cough: Secondary | ICD-10-CM

## 2023-06-28 DIAGNOSIS — J3489 Other specified disorders of nose and nasal sinuses: Secondary | ICD-10-CM

## 2023-06-28 DIAGNOSIS — J069 Acute upper respiratory infection, unspecified: Secondary | ICD-10-CM | POA: Diagnosis not present

## 2023-06-28 MED ORDER — AZITHROMYCIN 250 MG PO TABS: ORAL_TABLET | ORAL | 0 refills | Status: DC

## 2023-06-28 MED ORDER — BENZONATATE 100 MG PO CAPS: 100.0000 mg | ORAL_CAPSULE | Freq: Three times a day (TID) | ORAL | 0 refills | Status: DC

## 2023-06-28 NOTE — Discharge Instructions (Addendum)
Rest, push fluids, take medication as prescribed.  Follow-up with your PCP, return as needed.

## 2023-06-28 NOTE — ED Triage Notes (Signed)
cough,sore throat congestion wheezing 5 days

## 2023-06-28 NOTE — ED Provider Notes (Signed)
[facility]    CSN: [id number] Arrival date & time: 06/28/23  1232      History   Chief Complaint Chief Complaint  Patient presents with   Cough   Sore Throat    HPI Rebecca Boone is a 57 y.o. female.   57 year old female, Rebecca Boone, presents to urgent care for worsening cough, sore throat, wheezing x 5 days.  States she was a former smoker, recent illness exposure by husband.  Patient denies any chest pain shortness of breath or palpitations.  Requesting Z-Pak and Tessalon Perles as these have worked well for her in the past and she has numerous allergies  The history is provided by the patient. No language interpreter was used.    Past Medical History:  Diagnosis Date   Anxiety    Arthritis    Discoid lupus    Hyperlipidemia    Hypertension    MRSA (methicillin resistant staph aureus) culture positive    Neuropathy    Rosacea    Seizures (HCC)     Patient Active Problem List   Diagnosis Date Noted   Acute URI 06/28/2023   Sinus pressure 06/28/2023   Acute cough 06/28/2023    Past Surgical History:  Procedure Laterality Date   ABDOMINAL HYSTERECTOMY     COLONOSCOPY WITH PROPOFOL N/A 06/13/2015   Procedure: COLONOSCOPY WITH PROPOFOL;  Surgeon: Christena Deem, MD;  Location: [facility] ENDOSCOPY;  Service: Endoscopy;  Laterality: N/A;   GANGLION CYST EXCISION      OB History   No obstetric history on file.      Home Medications    Prior to Admission medications   Medication Sig Start Date End Date Taking? Authorizing Provider  amLODipine (NORVASC) 10 MG tablet Take 1 tablet by mouth daily. 11/20/20  Yes [provider]  azaTHIOprine (IMURAN) 50 MG tablet Take 1 tablet by mouth daily. 06/16/23  Yes [provider]  clonazePAM (KLONOPIN) 1 MG tablet Take 1 mg by mouth 2 (two) times daily.   Yes [provider]  enalapril (VASOTEC) 20 MG tablet Take 30 mg by mouth daily.   Yes [provider]   ergocalciferol (VITAMIN D2) 50000 UNITS capsule Take 50,000 Units by mouth once a week.   Yes [provider]  estradiol (ESTRACE) 0.5 MG tablet Take 0.5 mg by mouth daily. 04/17/20  Yes [provider]  FLUoxetine (PROZAC) 10 MG capsule Take by mouth.   Yes [provider]  metoprolol tartrate (LOPRESSOR) 25 MG tablet Take 12.5 mg by mouth 2 (two) times daily.   Yes [provider]  spironolactone (ALDACTONE) 25 MG tablet Take 1 tablet by mouth daily. 11/15/22 11/15/23 Yes [provider]  azithromycin (ZITHROMAX Z-PAK) 250 MG tablet Take 2 tabs today, 1 tab days 2-5 06/28/23   [name], Para March, NP  benzonatate (TESSALON) 100 MG capsule Take 1 capsule (100 mg total) by mouth every 8 (eight) hours. 06/28/23   [name], Para March, NP  cephALEXin (KEFLEX) 250 MG capsule Take 250 mg by mouth 2 (two) times daily. 06/30/22   [provider]  cyanocobalamin (,VITAMIN B-12,) 1000 MCG/ML injection Inject 1,000 mcg into the muscle once.    [provider]  fluconazole (DIFLUCAN) 150 MG tablet Take 1 tablet (150 mg total) by mouth daily. 02/11/22   Rodriguez-Southworth, [name], PA-C  gabapentin (NEURONTIN) 300 MG capsule Take 300 mg by mouth 3 (three) times daily. Take one capsule every morning and two capsules every night p.o.  [provider]  Ibuprofen-diphenhydrAMINE HCl (ADVIL PM) 200-25 MG CAPS Take 1 capsule by mouth at bedtime.    [provider]  PARoxetine (PAXIL) 10 MG tablet Take 10 mg by mouth daily.    [provider]  promethazine-dextromethorphan (PROMETHAZINE-DM) 6.25-15 MG/5ML syrup Take 5 mLs by mouth 4 (four) times daily as needed for cough. 07/09/22   Valinda Hoar, NP    Family History Family History  Problem Relation Age of Onset   Congestive Heart Failure Mother    Diabetes Mother    Hypertension Mother    Cancer Father    Hypertension Father    Hyperlipidemia Father     Social  History Social History   Tobacco Use   Smoking status: Former    Current packs/day: 0.00    Types: Cigarettes    Quit date: 07/21/2009    Years since quitting: 13.9   Smokeless tobacco: Never  Vaping Use   Vaping status: Never Used  Substance Use Topics   Alcohol use: Yes    Comment: social   Drug use: No     Allergies   Sulfa antibiotics, Amoxicillin, Ciprofloxacin, Dilantin [phenytoin sodium extended], Doxycycline, and Phenobarbital   Review of Systems Review of Systems  Constitutional:  Negative for fever.  HENT:  Positive for congestion and sore throat.   Respiratory:  Positive for cough and wheezing.   All other systems reviewed and are negative.    Physical Exam Triage Vital Signs ED Triage Vitals [06/28/23 1244]  Encounter Vitals Group     BP (!) 112/99     Systolic BP Percentile      Diastolic BP Percentile      Pulse Rate (!) 58     Resp 17     Temp 97.8 F (36.6 C)     Temp Source Oral     SpO2 94 %     Weight      Height      Head Circumference      Peak Flow      Pain Score      Pain Loc      Pain Education      Exclude from Growth Chart    No data found.  Updated Vital Signs BP (!) 112/99 (BP Location: Left Arm)   Pulse (!) 58   Temp 97.8 F (36.6 C) (Oral)   Resp 17   SpO2 94%   Visual Acuity Right Eye Distance:   Left Eye Distance:   Bilateral Distance:    Right Eye Near:   Left Eye Near:    Bilateral Near:     Physical Exam Vitals and nursing note reviewed.  Constitutional:      General: She is not in acute distress.    Appearance: She is well-developed and well-groomed.  HENT:     Head: Normocephalic and atraumatic.     Right Ear: Tympanic membrane is retracted.     Left Ear: Tympanic membrane is retracted.     Nose: Mucosal edema and congestion present.     Right Sinus: Maxillary sinus tenderness present. No frontal sinus tenderness.     Left Sinus: Maxillary sinus tenderness present. No frontal sinus tenderness.      Mouth/Throat:     Lips: Pink.     Mouth: Mucous membranes are moist.     Pharynx: Postnasal drip present.     Comments: + Yellow PND Eyes:     Conjunctiva/sclera: Conjunctivae normal.  Cardiovascular:  Rate and Rhythm: Normal rate and regular rhythm.     Pulses: Normal pulses.     Heart sounds: Normal heart sounds. No murmur heard. Pulmonary:     Effort: Pulmonary effort is normal. No respiratory distress.     Breath sounds: Normal breath sounds and air entry.  Abdominal:     Palpations: Abdomen is soft.     Tenderness: There is no abdominal tenderness.  Musculoskeletal:        General: No swelling.     Cervical back: Neck supple.  Skin:    General: Skin is warm and dry.     Capillary Refill: Capillary refill takes less than 2 seconds.  Neurological:     General: No focal deficit present.     Mental Status: She is alert and oriented to person, place, and time.     GCS: GCS eye subscore is 4. GCS verbal subscore is 5. GCS motor subscore is 6.     Cranial Nerves: No cranial nerve deficit.     Sensory: No sensory deficit.  Psychiatric:        Attention and Perception: Attention normal.        Mood and Affect: Mood normal.        Speech: Speech normal.        Behavior: Behavior normal. Behavior is cooperative.      UC Treatments / Results  Labs (all labs ordered are listed, but only abnormal results are displayed) Labs Reviewed - No data to display  EKG   Radiology No results found.  Procedures Procedures (including critical care time)  Medications Ordered in UC Medications - No data to display  Initial Impression / Assessment and Plan / UC Course  I have reviewed the triage vital signs and the nursing notes.  Pertinent labs & imaging results that were available during my care of the patient were reviewed by me and considered in my medical decision making (see chart for details).    Discussed exam findings and plan of care with patient, strict go to ER  precautions given.   Patient verbalized understanding to this provider.  Ddx: Acute uri, maxillary sinusitis, acute cough, Viral illness Final Clinical Impressions(s) / UC Diagnoses   Final diagnoses:  Acute URI  Sinus pressure  Acute cough     Discharge Instructions      Rest, push fluids, take medication as prescribed.  Follow-up with your PCP, return as needed.     ED Prescriptions     Medication Sig Dispense Auth. Provider   azithromycin (ZITHROMAX Z-PAK) 250 MG tablet Take 2 tabs today, 1 tab days 2-5 6 tablet Starlina Lapre, NP   benzonatate (TESSALON) 100 MG capsule Take 1 capsule (100 mg total) by mouth every 8 (eight) hours. 21 capsule Antia Rahal, Para March, NP      PDMP not reviewed this encounter.   Clancy Gourd, NP 06/28/23 1535

## 2023-09-09 DIAGNOSIS — M329 Systemic lupus erythematosus, unspecified: Secondary | ICD-10-CM | POA: Diagnosis present

## 2023-09-16 ENCOUNTER — Other Ambulatory Visit: Payer: Self-pay | Admitting: Gastroenterology

## 2023-09-16 DIAGNOSIS — R1084 Generalized abdominal pain: Secondary | ICD-10-CM

## 2023-09-16 DIAGNOSIS — K219 Gastro-esophageal reflux disease without esophagitis: Secondary | ICD-10-CM

## 2023-09-16 DIAGNOSIS — R197 Diarrhea, unspecified: Secondary | ICD-10-CM

## 2023-09-16 DIAGNOSIS — R634 Abnormal weight loss: Secondary | ICD-10-CM

## 2023-09-16 DIAGNOSIS — R1310 Dysphagia, unspecified: Secondary | ICD-10-CM

## 2023-09-20 ENCOUNTER — Encounter: Payer: Self-pay | Admitting: Gastroenterology

## 2023-09-30 ENCOUNTER — Ambulatory Visit
Admission: RE | Admit: 2023-09-30 | Discharge: 2023-09-30 | Disposition: A | Discharge status: Home / Self Care | Payer: Medicaid Other | Source: Ambulatory Visit | Attending: Gastroenterology | Admitting: Gastroenterology

## 2023-09-30 DIAGNOSIS — R634 Abnormal weight loss: Secondary | ICD-10-CM

## 2023-09-30 DIAGNOSIS — K219 Gastro-esophageal reflux disease without esophagitis: Secondary | ICD-10-CM

## 2023-09-30 DIAGNOSIS — R1084 Generalized abdominal pain: Secondary | ICD-10-CM

## 2023-09-30 DIAGNOSIS — R1310 Dysphagia, unspecified: Secondary | ICD-10-CM

## 2023-09-30 DIAGNOSIS — R197 Diarrhea, unspecified: Secondary | ICD-10-CM

## 2023-09-30 MED ORDER — IOPAMIDOL (ISOVUE-300) INJECTION 61%
100.0000 mL | Freq: Once | INTRAVENOUS | Status: AC | PRN
  Administered 2023-09-30: 100 mL via INTRAVENOUS

## 2023-10-13 ENCOUNTER — Other Ambulatory Visit: Payer: Self-pay | Admitting: Gastroenterology

## 2023-10-13 DIAGNOSIS — K869 Disease of pancreas, unspecified: Secondary | ICD-10-CM

## 2023-11-02 ENCOUNTER — Ambulatory Visit
Admission: EM | Admit: 2023-11-02 | Discharge: 2023-11-02 | Disposition: A | Discharge status: Home / Self Care | Attending: Physician Assistant | Admitting: Physician Assistant

## 2023-11-02 ENCOUNTER — Ambulatory Visit
Admission: RE | Admit: 2023-11-02 | Discharge: 2023-11-02 | Disposition: A | Discharge status: Home / Self Care | Payer: Medicaid Other | Source: Ambulatory Visit | Attending: Gastroenterology | Admitting: Gastroenterology

## 2023-11-02 DIAGNOSIS — Z8614 Personal history of Methicillin resistant Staphylococcus aureus infection: Secondary | ICD-10-CM | POA: Diagnosis not present

## 2023-11-02 DIAGNOSIS — L03211 Cellulitis of face: Secondary | ICD-10-CM

## 2023-11-02 DIAGNOSIS — L930 Discoid lupus erythematosus: Secondary | ICD-10-CM

## 2023-11-02 DIAGNOSIS — K869 Disease of pancreas, unspecified: Secondary | ICD-10-CM

## 2023-11-02 HISTORY — DX: Systemic lupus erythematosus, unspecified: M32.9

## 2023-11-02 MED ORDER — CLINDAMYCIN HCL 300 MG PO CAPS: 300.0000 mg | ORAL_CAPSULE | Freq: Three times a day (TID) | ORAL | 0 refills | Status: AC

## 2023-11-02 MED ORDER — GADOPICLENOL 0.5 MMOL/ML IV SOLN
7.0000 mL | Freq: Once | INTRAVENOUS | Status: AC | PRN
  Administered 2023-11-02: 7 mL via INTRAVENOUS

## 2023-11-02 NOTE — Discharge Instructions (Signed)
-  Your skin abnormality is related to lupus. You should clean the area gently with soap and water and apply corticosteroid topical ointment twice daily.  This will help you more than the antibiotic because it likely is not infected, it is just inflamed due to your autoimmune disease.  Follow-up with PCP or rheumatologist especially symptoms are persistent.

## 2023-11-02 NOTE — ED Provider Notes (Signed)
 [facility]    CSN: [id number] Arrival date & time: 11/02/23  1130      History   Chief Complaint Chief Complaint  Patient presents with   facial sore    HPI Rebecca Boone is a 58 y.o. female with history of discoid lupus, SLE, MRSA infection, hypertension, hyperlipidemia, and rosacea.  Today she presents for a painful rash/lesion on her chin for the past couple days.  She thinks it is related to lupus.  She believes it is infected and requests an antibiotic, specifically clindamycin.  There have been no fevers or drainage.  Has not been treating condition in any way at home.  No other concerns.  HPI  Past Medical History:  Diagnosis Date   Anxiety    Arthritis    Discoid lupus    Hyperlipidemia    Hypertension    Lupus (systemic lupus erythematosus) (HCC)    MRSA (methicillin resistant staph aureus) culture positive    Neuropathy    Rosacea    Seizures (HCC)     Patient Active Problem List   Diagnosis Date Noted   Acute URI 06/28/2023   Sinus pressure 06/28/2023   Acute cough 06/28/2023    Past Surgical History:  Procedure Laterality Date   ABDOMINAL HYSTERECTOMY     COLONOSCOPY WITH PROPOFOL N/A 06/13/2015   Procedure: COLONOSCOPY WITH PROPOFOL;  Surgeon: Christena Deem, MD;  Location: [facility] ENDOSCOPY;  Service: Endoscopy;  Laterality: N/A;   GANGLION CYST EXCISION      OB History   No obstetric history on file.      Home Medications    Prior to Admission medications   Medication Sig Start Date End Date Taking? Authorizing Provider  amLODipine (NORVASC) 10 MG tablet Take 1 tablet by mouth daily. 11/20/20  Yes [provider]  azaTHIOprine (IMURAN) 50 MG tablet Take 1 tablet by mouth daily. 06/16/23  Yes [provider]  azithromycin (ZITHROMAX Z-PAK) 250 MG tablet Take 2 tabs today, 1 tab days 2-5 06/28/23  Yes Defelice, Para March, NP  benzonatate (TESSALON) 100 MG capsule Take 1 capsule (100 mg total) by mouth every 8  (eight) hours. 06/28/23  Yes Defelice, Para March, NP  clindamycin (CLEOCIN) 300 MG capsule Take 1 capsule (300 mg total) by mouth 3 (three) times daily for 7 days. 11/02/23 11/09/23 Yes Shirlee Latch, PA-C  clonazePAM (KLONOPIN) 1 MG tablet Take 1 mg by mouth 2 (two) times daily.   Yes [provider]  cyanocobalamin (,VITAMIN B-12,) 1000 MCG/ML injection Inject 1,000 mcg into the muscle once.   Yes [provider]  enalapril (VASOTEC) 20 MG tablet Take 30 mg by mouth daily.   Yes [provider]  ergocalciferol (VITAMIN D2) 50000 UNITS capsule Take 50,000 Units by mouth once a week.   Yes [provider]  estradiol (ESTRACE) 0.5 MG tablet Take 0.5 mg by mouth daily. 04/17/20  Yes [provider]  fluconazole (DIFLUCAN) 150 MG tablet Take 1 tablet (150 mg total) by mouth daily. 02/11/22  Yes Rodriguez-Southworth, [name], PA-C  FLUoxetine (PROZAC) 10 MG capsule Take by mouth.   Yes [provider]  gabapentin (NEURONTIN) 300 MG capsule Take 300 mg by mouth 3 (three) times daily. Take one capsule every morning and two capsules every night p.o.   Yes [provider]  Ibuprofen-diphenhydrAMINE HCl (ADVIL PM) 200-25 MG CAPS Take 1 capsule by mouth at bedtime.   Yes [provider]  metoprolol tartrate (LOPRESSOR) 25 MG tablet Take  12.5 mg by mouth 2 (two) times daily.   Yes [provider]  PARoxetine (PAXIL) 10 MG tablet Take 10 mg by mouth daily.   Yes [provider]  spironolactone (ALDACTONE) 25 MG tablet Take 1 tablet by mouth daily. 11/15/22 11/15/23 Yes [provider]  cephALEXin (KEFLEX) 250 MG capsule Take 250 mg by mouth 2 (two) times daily. 06/30/22   [provider]  promethazine-dextromethorphan (PROMETHAZINE-DM) 6.25-15 MG/5ML syrup Take 5 mLs by mouth 4 (four) times daily as needed for cough. 07/09/22   Valinda Hoar, NP    Family History Family History  Problem Relation Age of  Onset   Congestive Heart Failure Mother    Diabetes Mother    Hypertension Mother    Cancer Father    Hypertension Father    Hyperlipidemia Father     Social History Social History   Tobacco Use   Smoking status: Former    Current packs/day: 0.00    Types: Cigarettes    Quit date: 07/21/2009    Years since quitting: 14.2   Smokeless tobacco: Never  Vaping Use   Vaping status: Never Used  Substance Use Topics   Alcohol use: Yes    Comment: social   Drug use: No     Allergies   Sulfa antibiotics, Amoxicillin, Ciprofloxacin, Dilantin [phenytoin sodium extended], Doxycycline, Phenobarbital, and Sulfasalazine   Review of Systems Review of Systems  Constitutional:  Negative for fatigue and fever.  HENT:  Negative for facial swelling.   Skin:  Positive for color change, rash and wound.  Neurological:  Negative for headaches.     Physical Exam Triage Vital Signs ED Triage Vitals  Encounter Vitals Group     BP 11/02/23 1158 106/60     Systolic BP Percentile --      Diastolic BP Percentile --      Pulse Rate 11/02/23 1158 (!) 41     Resp 11/02/23 1158 16     Temp 11/02/23 1158 97.8 F (36.6 C)     Temp Source 11/02/23 1158 Oral     SpO2 11/02/23 1158 97 %     Weight --      Height --      Head Circumference --      Peak Flow --      Pain Score 11/02/23 1156 8     Pain Loc --      Pain Education --      Exclude from Growth Chart --    No data found.  Updated Vital Signs BP 106/60 (BP Location: Right Arm)   Pulse (!) 47   Temp 97.8 F (36.6 C) (Oral)   Resp 16   SpO2 97%    Physical Exam Vitals and nursing note reviewed.  Constitutional:      General: She is not in acute distress.    Appearance: Normal appearance. She is not ill-appearing or toxic-appearing.  HENT:     Head: Normocephalic and atraumatic.     Nose: Nose normal.     Mouth/Throat:     Mouth: Mucous membranes are moist.     Pharynx: Oropharynx is clear.  Eyes:     General: No  scleral icterus.       Right eye: No discharge.        Left eye: No discharge.     Conjunctiva/sclera: Conjunctivae normal.  Cardiovascular:     Rate and Rhythm: Bradycardia present.  Pulmonary:     Effort: Pulmonary effort is  normal. No respiratory distress.  Musculoskeletal:     Cervical back: Neck supple.  Skin:    General: Skin is dry.     Findings: Lesion and rash present.     Comments: See image included in chart.  There is a circular area of erythema and swelling with central superficial ulceration of right jaw/chin.  Area is tender.  Neurological:     General: No focal deficit present.     Mental Status: She is alert. Mental status is at baseline.     Motor: No weakness.     Gait: Gait normal.  Psychiatric:        Mood and Affect: Mood normal.        Behavior: Behavior normal.      UC Treatments / Results  Labs (all labs ordered are listed, but only abnormal results are displayed) Labs Reviewed - No data to display  EKG   Radiology No results found.  Procedures Procedures (including critical care time)  Medications Ordered in UC Medications - No data to display  Initial Impression / Assessment and Plan / UC Course  I have reviewed the triage vital signs and the nursing notes.  Pertinent labs & imaging results that were available during my care of the patient were reviewed by me and considered in my medical decision making (see chart for details).   58 year old female with history of discoid and systemic lupus presents for painful lesion of chin/jaw for the past couple of days.  She also has a history of MRSA.  She request an antibiotic, specifically clindamycin for concerns regarding possible infection.  Heart rate is in the 40s.  Reviewed previous office visits and patient has had heart rate in the 40s before.  She has a history of bradycardia.  No associated fever or other abnormal vital sign.  Overall well-appearing.  No acute distress.  See image included  in chart.  Patient has area of circular erythema and slight induration with tenderness and superficial ulcer of the right jaw/chin.  This is consistent with discoid lupus flareup.  Possible infection.  Given history of MRSA will cover for possible infection.  Patient has a long list of allergies.  Sent clindamycin to pharmacy.  Also advised using topical corticosteroid.  She declines oral corticosteroid.  Advised following up with PCP or rheumatologist and improving over the next week or if symptoms worsen.  Patient is understanding and agreeable to plan.   Final Clinical Impressions(s) / UC Diagnoses   Final diagnoses:  Cellulitis of chin  Discoid lupus  History of MRSA infection     Discharge Instructions      -Your skin abnormality is related to lupus. You should clean the area gently with soap and water and apply corticosteroid topical ointment twice daily.  This will help you more than the antibiotic because it likely is not infected, it is just inflamed due to your autoimmune disease.  Follow-up with PCP or rheumatologist especially symptoms are persistent.    ED Prescriptions     Medication Sig Dispense Auth. Provider   clindamycin (CLEOCIN) 300 MG capsule Take 1 capsule (300 mg total) by mouth 3 (three) times daily for 7 days. 21 capsule Shirlee Latch, PA-C      PDMP not reviewed this encounter.   Shirlee Latch, PA-C 11/02/23 1236

## 2023-11-02 NOTE — ED Triage Notes (Signed)
 Patient has Lupus. Think this may be a flare up. Sore on her chin.

## 2023-11-24 ENCOUNTER — Ambulatory Visit

## 2023-11-24 DIAGNOSIS — R634 Abnormal weight loss: Secondary | ICD-10-CM | POA: Diagnosis not present

## 2023-11-24 DIAGNOSIS — K2950 Unspecified chronic gastritis without bleeding: Secondary | ICD-10-CM | POA: Diagnosis not present

## 2023-11-24 DIAGNOSIS — R197 Diarrhea, unspecified: Secondary | ICD-10-CM | POA: Diagnosis not present

## 2023-11-24 DIAGNOSIS — R1084 Generalized abdominal pain: Secondary | ICD-10-CM | POA: Diagnosis not present

## 2023-11-29 ENCOUNTER — Ambulatory Visit
Admission: EM | Admit: 2023-11-29 | Discharge: 2023-11-29 | Disposition: A | Discharge status: Home / Self Care | Attending: Emergency Medicine | Admitting: Emergency Medicine

## 2023-11-29 DIAGNOSIS — Z872 Personal history of diseases of the skin and subcutaneous tissue: Secondary | ICD-10-CM

## 2023-11-29 DIAGNOSIS — L089 Local infection of the skin and subcutaneous tissue, unspecified: Secondary | ICD-10-CM | POA: Diagnosis not present

## 2023-11-29 DIAGNOSIS — T148XXA Other injury of unspecified body region, initial encounter: Secondary | ICD-10-CM | POA: Diagnosis not present

## 2023-11-29 MED ORDER — CLINDAMYCIN HCL 300 MG PO CAPS: 300.0000 mg | ORAL_CAPSULE | Freq: Three times a day (TID) | ORAL | 0 refills | Status: AC

## 2023-11-29 MED ORDER — MUPIROCIN 2 % EX OINT: 1.0000 | TOPICAL_OINTMENT | Freq: Two times a day (BID) | CUTANEOUS | 0 refills | Status: DC

## 2023-11-29 NOTE — ED Triage Notes (Signed)
 Patient has Lupus. Think this may be a flare up. Sore on her chin.

## 2023-11-29 NOTE — ED Provider Notes (Signed)
 HPI  SUBJECTIVE:  Rebecca Boone is a 58 y.o. female who presents with a painful, circular lesion to her left chin surrounding an area where she was scratched by her dog 2 days ago.  She scribes the pain as sore, sharp, intermittent, lasting seconds to minutes.  She reports swelling and states that the pain is worse at night.  She reports scant purulent drainage.  She states that the lesion is getting better.  No fevers, crusting.  She has tried Bactroban, soap and water without improvement in her symptoms.  Symptoms are worse with palpation and with washing her face. she has a past medical history of discoid lupus, hyperlipidemia, hypertension, MRSA, rosacea, seizures, rheumatoid arthritis, Sjogren's syndrome.  She is not currently on any steroids or DMARDs.  [facility].  Rheumatology: [facility].    She was seen here on 3/5 for a rash/lesion on her chin, which was treated with clindamycin as she has multiple drug allergies.  She states clindamycin works well for her.   Past Medical History:  Diagnosis Date   Anxiety    Arthritis    Discoid lupus    Hyperlipidemia    Hypertension    Lupus (systemic lupus erythematosus) (HCC)    MRSA (methicillin resistant staph aureus) culture positive    Neuropathy    Rosacea    Seizures (HCC)     Past Surgical History:  Procedure Laterality Date   ABDOMINAL HYSTERECTOMY     COLONOSCOPY WITH PROPOFOL N/A 06/13/2015   Procedure: COLONOSCOPY WITH PROPOFOL;  Surgeon: Christena Deem, MD;  Location: [facility] ENDOSCOPY;  Service: Endoscopy;  Laterality: N/A;   GANGLION CYST EXCISION      Family History  Problem Relation Age of Onset   Congestive Heart Failure Mother    Diabetes Mother    Hypertension Mother    Cancer Father    Hypertension Father    Hyperlipidemia Father     Social History   Tobacco Use   Smoking status: Former    Current packs/day: 0.00    Types: Cigarettes    Quit date: 07/21/2009    Years since  quitting: 14.3   Smokeless tobacco: Never  Vaping Use   Vaping status: Never Used  Substance Use Topics   Alcohol use: Yes    Comment: social   Drug use: No    No current facility-administered medications for this encounter.  Current Outpatient Medications:    amLODipine (NORVASC) 10 MG tablet, Take 1 tablet by mouth daily., Disp: , Rfl:    azaTHIOprine (IMURAN) 50 MG tablet, Take 1 tablet by mouth daily., Disp: , Rfl:    clindamycin (CLEOCIN) 300 MG capsule, Take 1 capsule (300 mg total) by mouth 3 (three) times daily for 7 days. 7 mg/kg tid max 300 mg/dose, Disp: 21 capsule, Rfl: 0   cyanocobalamin (,VITAMIN B-12,) 1000 MCG/ML injection, Inject 1,000 mcg into the muscle once., Disp: , Rfl:    enalapril (VASOTEC) 20 MG tablet, Take 30 mg by mouth daily., Disp: , Rfl:    ergocalciferol (VITAMIN D2) 50000 UNITS capsule, Take 50,000 Units by mouth once a week., Disp: , Rfl:    estradiol (ESTRACE) 0.5 MG tablet, Take 0.5 mg by mouth daily., Disp: , Rfl:    FLUoxetine (PROZAC) 10 MG capsule, Take by mouth., Disp: , Rfl:    gabapentin (NEURONTIN) 300 MG capsule, Take 300 mg by mouth 3 (three) times daily. Take one capsule every morning and two capsules every night p.o., Disp: ,  Rfl:    mupirocin ointment (BACTROBAN) 2 %, Apply 1 Application topically 2 (two) times daily., Disp: 22 g, Rfl: 0   PARoxetine (PAXIL) 10 MG tablet, Take 10 mg by mouth daily., Disp: , Rfl:    cephALEXin (KEFLEX) 250 MG capsule, Take 250 mg by mouth 2 (two) times daily., Disp: , Rfl:    Ibuprofen-diphenhydrAMINE HCl (ADVIL PM) 200-25 MG CAPS, Take 1 capsule by mouth at bedtime., Disp: , Rfl:    metoprolol tartrate (LOPRESSOR) 25 MG tablet, Take 12.5 mg by mouth 2 (two) times daily., Disp: , Rfl:    spironolactone (ALDACTONE) 25 MG tablet, Take 1 tablet by mouth daily., Disp: , Rfl:   Allergies  Allergen Reactions   Sulfa Antibiotics Anaphylaxis and Other (See Comments)    Tongue swelling   Amoxicillin     Ciprofloxacin Other (See Comments)    Other reaction(s): Other (See Comments) blisters blisters    Dilantin [Phenytoin Sodium Extended]    Doxycycline    Phenobarbital     Other reaction(s): Other (See Comments), Unknown   Sulfasalazine Other (See Comments)     ROS  As noted in HPI.   Physical Exam  BP 115/69 (BP Location: Left Arm)   Pulse 68   Temp 97.8 F (36.6 C) (Oral)   Resp 15   SpO2 96%   Constitutional: Well developed, well nourished, no acute distress Eyes:  EOMI, conjunctiva normal bilaterally HENT: Normocephalic, atraumatic,mucus membranes moist Respiratory: Normal inspiratory effort Cardiovascular: Normal rate GI: nondistended skin: 2 x 2 cm round tender erythematous lesion with central scabbing.  0.5 x 0.5 cm area of induration with no central fluctuance.  No crusting.  No expressible purulent drainage. Inner lip normal.  Musculoskeletal: no deformities Neurologic: Alert & oriented x 3, no focal neuro deficits Psychiatric: Speech and behavior appropriate   ED Course   Medications - No data to display  No orders of the defined types were placed in this encounter.   No results found for this or any previous visit (from the past 24 hours). No results found.  ED Clinical Impression  1. Infected abrasion   2. History of discoid lupus erythematosus      ED Assessment/Plan     Previous records reviewed.  As noted in HPI  Patient presents with an infected abrasion to the skin, possibly causing a discoid lupus flare.  It is not a through and through laceration to the oral cavity.  She is to continue Hibiclens, will send home with clindamycin 300 mg 3 times daily for 7 days and Bactroban.  She states clindamycin worked well for her last time.  She states that she "does not do well with steroids".  Tylenol as needed.  Follow-up with PCP as needed.  ER return precautions given.  Discussed  MDM, treatment plan, and plan for follow-up with patient.  Discussed sn/sx that should prompt return to the ED. patient agrees with plan.   Meds ordered this encounter  Medications   clindamycin (CLEOCIN) 300 MG capsule    Sig: Take 1 capsule (300 mg total) by mouth 3 (three) times daily for 7 days. 7 mg/kg tid max 300 mg/dose    Dispense:  21 capsule    Refill:  0   mupirocin ointment (BACTROBAN) 2 %    Sig: Apply 1 Application topically 2 (two) times daily.    Dispense:  22 g    Refill:  0      *This clinic note was created using Dragon  dictation software. Therefore, there may be occasional mistakes despite careful proofreading.  ?    Domenick Gong, MD 11/29/23 1721

## 2023-11-29 NOTE — Discharge Instructions (Signed)
 Keep this clean with Hibiclens, continue Bactroban.  Finish clindamycin, even if you feel better.  Try Tylenol 1000 mg 4 times a day as needed for pain.  Follow-up with your primary care provider as needed, go to the ER for fevers, severe facial pain or swelling, or other concerns.

## 2024-01-18 ENCOUNTER — Other Ambulatory Visit: Payer: Self-pay | Admitting: Family Medicine

## 2024-01-18 DIAGNOSIS — Z1231 Encounter for screening mammogram for malignant neoplasm of breast: Secondary | ICD-10-CM

## 2024-01-31 ENCOUNTER — Ambulatory Visit
Admission: RE | Admit: 2024-01-31 | Discharge: 2024-01-31 | Disposition: A | Discharge status: Home / Self Care | Source: Ambulatory Visit | Attending: Family Medicine | Admitting: Family Medicine

## 2024-01-31 DIAGNOSIS — Z1231 Encounter for screening mammogram for malignant neoplasm of breast: Secondary | ICD-10-CM | POA: Insufficient documentation

## 2024-02-22 ENCOUNTER — Other Ambulatory Visit: Payer: Self-pay | Admitting: Emergency Medicine

## 2024-02-22 DIAGNOSIS — J4489 Other specified chronic obstructive pulmonary disease: Secondary | ICD-10-CM

## 2024-02-22 DIAGNOSIS — R0602 Shortness of breath: Secondary | ICD-10-CM

## 2024-02-22 DIAGNOSIS — J310 Chronic rhinitis: Secondary | ICD-10-CM

## 2024-02-22 DIAGNOSIS — Z87891 Personal history of nicotine dependence: Secondary | ICD-10-CM

## 2024-02-23 ENCOUNTER — Ambulatory Visit
Admission: RE | Admit: 2024-02-23 | Discharge: 2024-02-23 | Disposition: A | Discharge status: Home / Self Care | Source: Ambulatory Visit | Attending: Emergency Medicine | Admitting: Emergency Medicine

## 2024-02-23 DIAGNOSIS — J310 Chronic rhinitis: Secondary | ICD-10-CM | POA: Insufficient documentation

## 2024-02-23 DIAGNOSIS — Z87891 Personal history of nicotine dependence: Secondary | ICD-10-CM | POA: Diagnosis present

## 2024-02-23 DIAGNOSIS — J4489 Other specified chronic obstructive pulmonary disease: Secondary | ICD-10-CM | POA: Insufficient documentation

## 2024-02-23 DIAGNOSIS — R0602 Shortness of breath: Secondary | ICD-10-CM | POA: Insufficient documentation

## 2024-04-11 DIAGNOSIS — J449 Chronic obstructive pulmonary disease, unspecified: Secondary | ICD-10-CM | POA: Diagnosis present

## 2024-04-23 ENCOUNTER — Other Ambulatory Visit
Admission: RE | Admit: 2024-04-23 | Discharge: 2024-04-23 | Disposition: A | Discharge status: Home / Self Care | Source: Ambulatory Visit | Attending: Pulmonary Disease | Admitting: Pulmonary Disease

## 2024-04-23 DIAGNOSIS — R0600 Dyspnea, unspecified: Secondary | ICD-10-CM | POA: Diagnosis present

## 2024-04-23 DIAGNOSIS — R0689 Other abnormalities of breathing: Secondary | ICD-10-CM | POA: Insufficient documentation

## 2024-04-23 LAB — D-DIMER, QUANTITATIVE: D-Dimer, Quant: 0.29 ug{FEU}/mL (ref 0.00–0.50)

## 2024-04-24 ENCOUNTER — Other Ambulatory Visit: Payer: Self-pay | Admitting: Pulmonary Disease

## 2024-04-24 DIAGNOSIS — R0989 Other specified symptoms and signs involving the circulatory and respiratory systems: Secondary | ICD-10-CM

## 2024-04-24 DIAGNOSIS — R0602 Shortness of breath: Secondary | ICD-10-CM

## 2024-04-24 DIAGNOSIS — R0600 Dyspnea, unspecified: Secondary | ICD-10-CM

## 2024-04-27 ENCOUNTER — Ambulatory Visit
Admission: RE | Admit: 2024-04-27 | Discharge: 2024-04-27 | Disposition: A | Discharge status: Home / Self Care | Source: Ambulatory Visit | Attending: Pulmonary Disease | Admitting: Pulmonary Disease

## 2024-04-27 DIAGNOSIS — R0689 Other abnormalities of breathing: Secondary | ICD-10-CM | POA: Diagnosis present

## 2024-04-27 DIAGNOSIS — R0600 Dyspnea, unspecified: Secondary | ICD-10-CM | POA: Insufficient documentation

## 2024-04-27 DIAGNOSIS — R0602 Shortness of breath: Secondary | ICD-10-CM | POA: Insufficient documentation

## 2024-04-27 DIAGNOSIS — R0989 Other specified symptoms and signs involving the circulatory and respiratory systems: Secondary | ICD-10-CM | POA: Insufficient documentation

## 2024-04-27 MED ORDER — IOHEXOL 350 MG/ML SOLN
75.0000 mL | Freq: Once | INTRAVENOUS | Status: AC | PRN
  Administered 2024-04-27: 75 mL via INTRAVENOUS

## 2024-05-03 ENCOUNTER — Observation Stay
Admission: EM | Admit: 2024-05-03 | Discharge: 2024-05-04 | Disposition: A | Discharge status: Home / Self Care | Attending: Osteopathic Medicine | Admitting: Osteopathic Medicine

## 2024-05-03 ENCOUNTER — Emergency Department

## 2024-05-03 ENCOUNTER — Other Ambulatory Visit: Payer: Self-pay

## 2024-05-03 ENCOUNTER — Inpatient Hospital Stay

## 2024-05-03 DIAGNOSIS — R569 Unspecified convulsions: Secondary | ICD-10-CM | POA: Diagnosis not present

## 2024-05-03 DIAGNOSIS — M3210 Systemic lupus erythematosus, organ or system involvement unspecified: Secondary | ICD-10-CM | POA: Insufficient documentation

## 2024-05-03 DIAGNOSIS — I959 Hypotension, unspecified: Secondary | ICD-10-CM | POA: Diagnosis present

## 2024-05-03 DIAGNOSIS — Z8669 Personal history of other diseases of the nervous system and sense organs: Secondary | ICD-10-CM

## 2024-05-03 DIAGNOSIS — R55 Syncope and collapse: Principal | ICD-10-CM | POA: Insufficient documentation

## 2024-05-03 DIAGNOSIS — F419 Anxiety disorder, unspecified: Secondary | ICD-10-CM | POA: Insufficient documentation

## 2024-05-03 DIAGNOSIS — L930 Discoid lupus erythematosus: Secondary | ICD-10-CM | POA: Diagnosis present

## 2024-05-03 DIAGNOSIS — E871 Hypo-osmolality and hyponatremia: Secondary | ICD-10-CM | POA: Diagnosis not present

## 2024-05-03 DIAGNOSIS — E785 Hyperlipidemia, unspecified: Secondary | ICD-10-CM | POA: Diagnosis not present

## 2024-05-03 DIAGNOSIS — J449 Chronic obstructive pulmonary disease, unspecified: Secondary | ICD-10-CM | POA: Diagnosis not present

## 2024-05-03 DIAGNOSIS — M359 Systemic involvement of connective tissue, unspecified: Secondary | ICD-10-CM | POA: Diagnosis not present

## 2024-05-03 DIAGNOSIS — I10 Essential (primary) hypertension: Secondary | ICD-10-CM | POA: Diagnosis not present

## 2024-05-03 DIAGNOSIS — R404 Transient alteration of awareness: Principal | ICD-10-CM

## 2024-05-03 DIAGNOSIS — M329 Systemic lupus erythematosus, unspecified: Secondary | ICD-10-CM | POA: Diagnosis present

## 2024-05-03 DIAGNOSIS — F10920 Alcohol use, unspecified with intoxication, uncomplicated: Secondary | ICD-10-CM | POA: Insufficient documentation

## 2024-05-03 DIAGNOSIS — R531 Weakness: Secondary | ICD-10-CM

## 2024-05-03 DIAGNOSIS — Z79899 Other long term (current) drug therapy: Secondary | ICD-10-CM | POA: Insufficient documentation

## 2024-05-03 LAB — CBC
HCT: 39.2 % (ref 36.0–46.0)
Hemoglobin: 13.6 g/dL (ref 12.0–15.0)
MCH: 30.4 pg (ref 26.0–34.0)
MCHC: 34.7 g/dL (ref 30.0–36.0)
MCV: 87.5 fL (ref 80.0–100.0)
Platelets: 214 K/uL (ref 150–400)
RBC: 4.48 MIL/uL (ref 3.87–5.11)
RDW: 12.2 % (ref 11.5–15.5)
WBC: 6.7 K/uL (ref 4.0–10.5)
nRBC: 0 % (ref 0.0–0.2)

## 2024-05-03 LAB — TSH: TSH: 10.482 u[IU]/mL — ABNORMAL HIGH (ref 0.350–4.500)

## 2024-05-03 LAB — COMPREHENSIVE METABOLIC PANEL WITH GFR
ALT: 18 U/L (ref 0–44)
AST: 25 U/L (ref 15–41)
Albumin: 4.2 g/dL (ref 3.5–5.0)
Alkaline Phosphatase: 58 U/L (ref 38–126)
Anion gap: 9 (ref 5–15)
BUN: 32 mg/dL — ABNORMAL HIGH (ref 6–20)
CO2: 22 mmol/L (ref 22–32)
Calcium: 9.1 mg/dL (ref 8.9–10.3)
Chloride: 99 mmol/L (ref 98–111)
Creatinine, Ser: 1.56 mg/dL — ABNORMAL HIGH (ref 0.44–1.00)
GFR, Estimated: 38 mL/min — ABNORMAL LOW (ref >60)
Glucose, Bld: 120 mg/dL — ABNORMAL HIGH (ref 70–99)
Potassium: 4.2 mmol/L (ref 3.5–5.1)
Sodium: 130 mmol/L — ABNORMAL LOW (ref 135–145)
Total Bilirubin: 0.6 mg/dL (ref 0.0–1.2)
Total Protein: 7.4 g/dL (ref 6.5–8.1)

## 2024-05-03 LAB — T4, FREE: Free T4: 0.96 ng/dL (ref 0.61–1.12)

## 2024-05-03 LAB — TROPONIN I (HIGH SENSITIVITY)
Troponin I (High Sensitivity): <2 ng/L (ref <18)
Troponin I (High Sensitivity): 3 ng/L (ref <18)

## 2024-05-03 LAB — D-DIMER, QUANTITATIVE: D-Dimer, Quant: 0.37 ug{FEU}/mL (ref 0.00–0.50)

## 2024-05-03 LAB — CBG MONITORING, ED: Glucose-Capillary: 121 mg/dL — ABNORMAL HIGH (ref 70–99)

## 2024-05-03 MED ORDER — ESTRADIOL 0.5 MG PO TABS: 0.5000 mg | ORAL_TABLET | Freq: Every day | ORAL | Status: DC

## 2024-05-03 MED ORDER — AZATHIOPRINE 50 MG PO TABS: 50.0000 mg | ORAL_TABLET | Freq: Every day | ORAL | Status: DC

## 2024-05-03 MED ORDER — ENOXAPARIN SODIUM 40 MG/0.4ML IJ SOSY
40.0000 mg | PREFILLED_SYRINGE | INTRAMUSCULAR | Status: DC
Start: 2024-05-04 — End: 2024-05-04
  Administered 2024-05-04: 40 mg via SUBCUTANEOUS
  Filled 2024-05-03: qty 0.4

## 2024-05-03 MED ORDER — SODIUM CHLORIDE 0.9 % IV SOLN: INTRAVENOUS | Status: DC

## 2024-05-03 MED ORDER — LEVETIRACETAM (KEPPRA) 500 MG/5 ML ADULT IV PUSH
1500.0000 mg | Freq: Once | INTRAVENOUS | Status: AC
  Administered 2024-05-04: 1500 mg via INTRAVENOUS
  Filled 2024-05-03: qty 15

## 2024-05-03 MED ORDER — ONDANSETRON HCL 4 MG/2ML IJ SOLN: 4.0000 mg | Freq: Four times a day (QID) | INTRAMUSCULAR | Status: DC | PRN

## 2024-05-03 MED ORDER — GABAPENTIN 300 MG PO CAPS
300.0000 mg | ORAL_CAPSULE | Freq: Every day | ORAL | Status: DC
  Administered 2024-05-04: 300 mg via ORAL
  Filled 2024-05-03: qty 1

## 2024-05-03 MED ORDER — FLUOXETINE HCL 20 MG PO CAPS
20.0000 mg | ORAL_CAPSULE | Freq: Every day | ORAL | Status: DC
  Administered 2024-05-04: 20 mg via ORAL
  Filled 2024-05-03: qty 1

## 2024-05-03 MED ORDER — GABAPENTIN 300 MG PO CAPS: 300.0000 mg | ORAL_CAPSULE | Freq: Three times a day (TID) | ORAL | Status: DC

## 2024-05-03 MED ORDER — SODIUM CHLORIDE 0.9 % IV BOLUS
1000.0000 mL | Freq: Once | INTRAVENOUS | Status: AC
  Administered 2024-05-03: 1000 mL via INTRAVENOUS

## 2024-05-03 MED ORDER — GABAPENTIN 300 MG PO CAPS
600.0000 mg | ORAL_CAPSULE | Freq: Every day | ORAL | Status: DC
  Administered 2024-05-04: 600 mg via ORAL
  Filled 2024-05-03: qty 2

## 2024-05-03 MED ORDER — LEVETIRACETAM 750 MG PO TABS
750.0000 mg | ORAL_TABLET | Freq: Two times a day (BID) | ORAL | Status: DC
  Administered 2024-05-04: 750 mg via ORAL
  Filled 2024-05-03 (×2): qty 1

## 2024-05-03 MED ORDER — ONDANSETRON HCL 4 MG PO TABS
4.0000 mg | ORAL_TABLET | Freq: Four times a day (QID) | ORAL | Status: DC | PRN
Start: 2024-05-03 — End: 2024-05-04

## 2024-05-03 MED ORDER — SODIUM CHLORIDE 0.9% FLUSH
3.0000 mL | Freq: Two times a day (BID) | INTRAVENOUS | Status: DC
  Administered 2024-05-04 (×2): 3 mL via INTRAVENOUS

## 2024-05-03 NOTE — H&P (Signed)
 History and Physical    Patient: Rebecca Boone FMW:[id number] DOB: [birth year] DOA: 05/03/2024 DOS: the patient was seen and examined on 05/03/2024 PCP: Valora Agent, MD  Patient coming from: Home  Chief Complaint:  Chief Complaint  Patient presents with   Loss of Consciousness   HPI: Rebecca Boone is a 58 y.o. female with medical history significant of autoimmune diseases including discoid lupus, SLE, rosacea, anxiety disorder, osteoarthritis, peripheral neuropathy, essential hypertension, hyperlipidemia who was brought in by her husband secondary to recurrent passing out at home.  Patient has a remote history of seizure disorder but not on any antiseizure medications.  Per husband she started having this episode on and off for a while.  Patient has always refused to come in to the hospital.  It usually lasts minutes and then resolves.  When it resolves patient is able to function.  Denied any postictal status.  Patient was hypotensive today after the episode with systolic in the 70s.  In the ER her initial systolic blood pressure was also 80.  No prior workup for syncopal episode and no known cardiac disease.  Patient is not chronically on steroids.  At this point she has been admitted with a diagnosis of syncope versus seizure episodes that are recurrent.  Review of Systems: As mentioned in the history of present illness. All other systems reviewed and are negative. Past Medical History:  Diagnosis Date   Anxiety    Arthritis    Discoid lupus    Hyperlipidemia    Hypertension    Lupus (systemic lupus erythematosus) (HCC)    MRSA (methicillin resistant staph aureus) culture positive    Neuropathy    Rosacea    Seizures (HCC)    Past Surgical History:  Procedure Laterality Date   ABDOMINAL HYSTERECTOMY     COLONOSCOPY WITH PROPOFOL  N/A 06/13/2015   Procedure: COLONOSCOPY WITH PROPOFOL ;  Surgeon: Gladis RAYMOND Mariner, MD;  Location: [facility] ENDOSCOPY;  Service: Endoscopy;   Laterality: N/A;   GANGLION CYST EXCISION     Social History:  reports that she quit smoking about 14 years ago. Her smoking use included cigarettes. She has never used smokeless tobacco. She reports current alcohol use. She reports that she does not use drugs.  Allergies  Allergen Reactions   Sulfa Antibiotics Anaphylaxis and Other (See Comments)    Tongue swelling   Amoxicillin    Ciprofloxacin Other (See Comments)    Other reaction(s): Other (See Comments) blisters blisters    Dilantin [Phenytoin Sodium Extended]    Doxycycline    Phenobarbital     Other reaction(s): Other (See Comments), Unknown   Sulfasalazine Other (See Comments)    Family History  Problem Relation Age of Onset   Congestive Heart Failure Mother    Diabetes Mother    Hypertension Mother    Cancer Father    Hypertension Father    Hyperlipidemia Father     Prior to Admission medications   Medication Sig Start Date End Date Taking? Authorizing Provider  amLODipine (NORVASC) 10 MG tablet Take 1 tablet by mouth daily. 11/20/20   [provider]  azaTHIOprine  (IMURAN ) 50 MG tablet Take 1 tablet by mouth daily. 06/16/23   [provider]  cephALEXin (KEFLEX) 250 MG capsule Take 250 mg by mouth 2 (two) times daily. 06/30/22   [provider]  cyanocobalamin (,VITAMIN B-12,) 1000 MCG/ML injection Inject 1,000 mcg into the muscle once.    [provider]  enalapril (VASOTEC) 20 MG tablet Take  30 mg by mouth daily.    [provider]  ergocalciferol (VITAMIN D2) 50000 UNITS capsule Take 50,000 Units by mouth once a week.    [provider]  estradiol  (ESTRACE ) 0.5 MG tablet Take 0.5 mg by mouth daily. 04/17/20   [provider]  FLUoxetine  (PROZAC ) 10 MG capsule Take by mouth.    [provider]  gabapentin  (NEURONTIN ) 300 MG capsule Take 300 mg by mouth 3 (three) times daily. Take one capsule every morning and two capsules every night p.o.     [provider]  Ibuprofen-diphenhydrAMINE  HCl (ADVIL PM) 200-25 MG CAPS Take 1 capsule by mouth at bedtime.    [provider]  metoprolol tartrate (LOPRESSOR) 25 MG tablet Take 12.5 mg by mouth 2 (two) times daily.    [provider]  mupirocin  ointment (BACTROBAN ) 2 % Apply 1 Application topically 2 (two) times daily. 11/29/23   Mortenson, Ashley, MD  PARoxetine (PAXIL) 10 MG tablet Take 10 mg by mouth daily.    [provider]  spironolactone (ALDACTONE) 25 MG tablet Take 1 tablet by mouth daily. 11/15/22 11/15/23  [provider]    Physical Exam: Vitals:   05/03/24 1924 05/03/24 1928 05/03/24 2118  BP: (!) 91/59 (!) 89/60 123/63  Pulse: 60  (!) 52  Resp: 16  18  Temp: 97.7 F (36.5 C)  97.8 F (36.6 C)  TempSrc:   Oral  SpO2: 96%  100%   Constitutional: Acutely ill looking, NAD, calm, comfortable Eyes: PERRL, lids and conjunctivae normal ENMT: Mucous membranes are moist. Posterior pharynx clear of any exudate or lesions.Normal dentition.  Neck: normal, supple, no masses, no thyromegaly Respiratory: clear to auscultation bilaterally, no wheezing, no crackles. Normal respiratory effort. No accessory muscle use.  Cardiovascular: Regular rate and rhythm, no murmurs / rubs / gallops. No extremity edema. 2+ pedal pulses. No carotid bruits.  Abdomen: no tenderness, no masses palpated. No hepatosplenomegaly. Bowel sounds positive.  Musculoskeletal: Good range of motion, no joint swelling or tenderness, Skin: no rashes, lesions, ulcers. No induration Neurologic: CN 2-12 grossly intact. Sensation intact, DTR normal. Strength 5/5 in all 4.  Psychiatric: Normal judgment and insight. Alert and oriented x 3. Normal mood  Data Reviewed:  Blood pressure is 89/60, pulse 52, sodium 130 BUN 32 creatinine 1.56, glucose 120.Head CT without contrast showed no acute findings chest x-ray also shows no acute findings.  EKG showed sinus bradycardia with no  significant changes.  Assessment and Plan:  #1 recurrent syncope versus seizure: Patient had classic passing out but not sure if this was syncopal episode or seizure disorder.  She is at risk for either.  She has remote history of seizure disorder not on any treatment.  Husband describes her eyes rolling backwards when she passed out.  She could therefore have had a severe episode.  We will get EEG.  Patient will be loaded with Keppra  until workup is completed.  Patient will also have had recurrent syncopal episodes especially with the blood pressure being on the low side.  We will get echocardiogram as well as MRI of the brain.  Depending on the initial results we will consider cardiology versus neurology consult or both.  #2 SLE: Continue home regimen  #3 discoid lupus: Continue home regimen especially from rheumatologist  #4 essential hypertension: Continue blood pressure monitoring.  Patient is hypotensive now.  She is on multiple blood pressure medications which will need to be reassessed.  #5 hyperlipidemia: Not on statin.  May need to start treatment.  #6 hyponatremia: Initiate some saline.  Appears to be dehydrated.  Hold all diuretics  #7 history of seizure disorder: Initiate Keppra  as above  #8 COPD: No acute exacerbation.  #9 anxiety disorder: Continue anxiety medications.    Advance Care Planning:   Code Status: Full Code   Consults: None at the moment but may need cardiology and neurology consult  Family Communication: Husband at bedside  Severity of Illness: The appropriate patient status for this patient is INPATIENT. Inpatient status is judged to be reasonable and necessary in order to provide the required intensity of service to ensure the patient's safety. The patient's presenting symptoms, physical exam findings, and initial radiographic and laboratory data in the context of their chronic comorbidities is felt to place them at high risk for further clinical  deterioration. Furthermore, it is not anticipated that the patient will be medically stable for discharge from the hospital within 2 midnights of admission.   * I certify that at the point of admission it is my clinical judgment that the patient will require inpatient hospital care spanning beyond 2 midnights from the point of admission due to high intensity of service, high risk for further deterioration and high frequency of surveillance required.*  AuthorBETHA SIM KNOLL, MD 05/03/2024 10:20 PM  For on call review [email or web address].

## 2024-05-03 NOTE — ED Provider Notes (Signed)
 [facility] Provider Note    Event Date/Time   First MD Initiated Contact with Patient 05/03/24 2054     (approximate)   History   Loss of Consciousness   HPI  Rebecca Boone is a 58 y.o. female  discoid lupus, SLE, MRSA infection, hypertension, hyperlipidemia, and rosacea who presents after an episode of unresponsiveness.  Patient states that she was on the toilet after having gas pains and while she tried to have a BM became unresponsive.  She did not hit her head.  Patient reports that she has been having syncopal episodes or episodes of unresponsiveness since March of this year.  She has followed up with her primary care physician who referred her to pulmonology but has never followed up yet with her cardiologist.  She does report that she has had fluctuating blood pressures over the past several months as well as low as 72/59. She does report a prior history of seizures in adolescence and young adulthood and has been off all antiepileptics for several years.  She does have a mild headache but denies any hearing or vision changes, chest pain, abdominal pain changes in urinary habits.  She reports shortness of breath at baseline and this is unchanged, she does have a prior history of smoking.        Physical Exam   Triage Vital Signs: ED Triage Vitals  Encounter Vitals Group     BP 05/03/24 1924 (!) 91/59     Girls Systolic BP Percentile --      Girls Diastolic BP Percentile --      Boys Systolic BP Percentile --      Boys Diastolic BP Percentile --      Pulse Rate 05/03/24 1924 60     Resp 05/03/24 1924 16     Temp 05/03/24 1924 97.7 F (36.5 C)     Temp src --      SpO2 05/03/24 1924 96 %     Weight --      Height --      Head Circumference --      Peak Flow --      Pain Score 05/03/24 1920 0     Pain Loc --      Pain Education --      Exclude from Growth Chart --     Most recent vital signs: Vitals:   05/03/24 1928 05/03/24 2118   BP: (!) 89/60 123/63  Pulse:  (!) 52  Resp:  18  Temp:  97.8 F (36.6 C)  SpO2:  100%    Nursing Triage Note reviewed. Vital signs reviewed and patients oxygen saturation is normoxic  General: Patient is very thin , well developed, awake and alert, resting comfortably in no acute distress Head: Normocephalic and atraumatic Eyes: Normal inspection, extraocular muscles intact, no conjunctival pallor Ear, nose, throat: Normal external exam Neck: Normal range of motion Respiratory: Patient is in no respiratory distress, lungs with mild wheeze Cardiovascular: Patient is not tachycardic, RRR without murmur appreciated GI: Abd SNT with no guarding or rebound  Back: Normal inspection of the back with good strength and range of motion throughout all ext Extremities: pulses intact with good cap refills, no LE pitting edema or calf tenderness Neuro: The patient is alert and oriented to person, place, and time, appropriately conversive, with 5/5 bilat UE/LE strength, no gross motor or sensory defects noted. Coordination appears to be adequate. Skin: Warm, dry, and intact Psych: normal mood and  affect, no SI or HI  ED Results / Procedures / Treatments   Labs (all labs ordered are listed, but only abnormal results are displayed) Labs Reviewed  COMPREHENSIVE METABOLIC PANEL WITH GFR - Abnormal; Notable for the following components:      Result Value   Sodium 130 (*)    Glucose, Bld 120 (*)    BUN 32 (*)    Creatinine, Ser 1.56 (*)    GFR, Estimated 38 (*)    All other components within normal limits  CBG MONITORING, ED - Abnormal; Notable for the following components:   Glucose-Capillary 121 (*)    All other components within normal limits  CBC  URINALYSIS, ROUTINE W REFLEX MICROSCOPIC  D-DIMER, QUANTITATIVE  TSH  T4, FREE  TROPONIN I (HIGH SENSITIVITY)  TROPONIN I (HIGH SENSITIVITY)     EKG EKG and rhythm strip are interpreted by myself:   EKG: [bradycardic sinus rhythm] at  heart rate of 64, normal QRS duration, QTc 385, nonspecific ST segments and T waves no ectopy EKG not consistent with Acute STEMI Rhythm strip: bradycardic rhythm in lead II   RADIOLOGY CT head: No intracranial hemorrhage, independent review interpretation Xray chest: No acute abnormality on my independent review interpretation    PROCEDURES:  Critical Care performed: No  Procedures   MEDICATIONS ORDERED IN ED: Medications  sodium chloride  0.9 % bolus 1,000 mL (1,000 mLs Intravenous New Bag/Given 05/03/24 2125)     IMPRESSION / MDM / ASSESSMENT AND PLAN / ED COURSE                                Differential diagnosis includes, but is not limited to, syncope, seizure, orthostasis, electrolyte derangement arrhythmia, intracranial hemorrhage, pneumonia   ED course: Patient presents with an episode of recurrent loss of consciousness.  She is not tachycardic but she is hypotensive, she does have several rate controlling meds on her medication list including amlodipine and metoprolol and I wonder if this may be contributing.  Although this sounds like syncope, she does have a history of seizures as well.  Patient reports increasing frequency of these episodes.  She has not seen cardiology and does not have an echo on file.  Will obtain a CT head despite no headache with the concern for recurrent possible seizures.  Will discuss possible admission today with the hospitalist for echo and cardiology consultation in the morning   Clinical Course as of 05/03/24 2207  Thu May 03, 2024  2123 Sodium(!): 130 Mild hyponatremia will give a bolus of sodium [HD]  2123 Creatinine(!): 1.56 No old to compare [HD]  2123 CBC No anemia or leukocytosis [HD]  2145 Troponin I (High Sensitivity): <2 Not elevated [HD]  2145 BP: 123/63 Improved [HD]  2156 Patient accepted by hospitalist for admission [HD]    Clinical Course User Index [HD] Nicholaus Rolland BRAVO, MD     FINAL CLINICAL IMPRESSION(S) /  ED DIAGNOSES   Final diagnoses:  Unresponsive episode  Transient hypotension  General weakness     Rx / DC Orders   ED Discharge Orders     None        Note:  This document was prepared using Dragon voice recognition software and may include unintentional dictation errors.   Nicholaus Rolland BRAVO, MD 05/03/24 2207

## 2024-05-03 NOTE — ED Triage Notes (Signed)
 Pt says that she came home and she was having gas pains, she was sitting on the toilet trying have a BM. Pt spouse says that he went in the bathroom and she became unresponsive. She did not hit her head.  Pt reports that she has been having issues with passing out since June, that she has seen several doctors and had testing for the same. She is alert/oriented reports she just feels tired

## 2024-05-04 ENCOUNTER — Inpatient Hospital Stay

## 2024-05-04 ENCOUNTER — Inpatient Hospital Stay (HOSPITAL_COMMUNITY)
Admit: 2024-05-04 | Discharge: 2024-05-04 | Disposition: A | Discharge status: Home / Self Care | Attending: Internal Medicine | Admitting: Internal Medicine

## 2024-05-04 DIAGNOSIS — R55 Syncope and collapse: Secondary | ICD-10-CM

## 2024-05-04 DIAGNOSIS — R569 Unspecified convulsions: Secondary | ICD-10-CM

## 2024-05-04 LAB — COMPREHENSIVE METABOLIC PANEL WITH GFR
ALT: 15 U/L (ref 0–44)
AST: 21 U/L (ref 15–41)
Albumin: 3.5 g/dL (ref 3.5–5.0)
Alkaline Phosphatase: 59 U/L (ref 38–126)
Anion gap: 13 (ref 5–15)
BUN: 30 mg/dL — ABNORMAL HIGH (ref 6–20)
CO2: 23 mmol/L (ref 22–32)
Calcium: 9 mg/dL (ref 8.9–10.3)
Chloride: 102 mmol/L (ref 98–111)
Creatinine, Ser: 1.03 mg/dL — ABNORMAL HIGH (ref 0.44–1.00)
GFR, Estimated: >60 mL/min (ref >60)
Glucose, Bld: 118 mg/dL — ABNORMAL HIGH (ref 70–99)
Potassium: 3.8 mmol/L (ref 3.5–5.1)
Sodium: 134 mmol/L — ABNORMAL LOW (ref 135–145)
Total Bilirubin: 0.4 mg/dL (ref 0.0–1.2)
Total Protein: 6.3 g/dL — ABNORMAL LOW (ref 6.5–8.1)

## 2024-05-04 LAB — CBC
HCT: 35.2 % — ABNORMAL LOW (ref 36.0–46.0)
HCT: 35.5 % — ABNORMAL LOW (ref 36.0–46.0)
Hemoglobin: 12.2 g/dL (ref 12.0–15.0)
Hemoglobin: 12.3 g/dL (ref 12.0–15.0)
MCH: 30.7 pg (ref 26.0–34.0)
MCH: 30.5 pg (ref 26.0–34.0)
MCHC: 34.7 g/dL (ref 30.0–36.0)
MCHC: 34.6 g/dL (ref 30.0–36.0)
MCV: 88.4 fL (ref 80.0–100.0)
MCV: 88.1 fL (ref 80.0–100.0)
Platelets: 167 K/uL (ref 150–400)
Platelets: 164 K/uL (ref 150–400)
RBC: 3.98 MIL/uL (ref 3.87–5.11)
RBC: 4.03 MIL/uL (ref 3.87–5.11)
RDW: 12.2 % (ref 11.5–15.5)
RDW: 12.2 % (ref 11.5–15.5)
WBC: 4.8 K/uL (ref 4.0–10.5)
WBC: 4.7 K/uL (ref 4.0–10.5)
nRBC: 0 % (ref 0.0–0.2)
nRBC: 0 % (ref 0.0–0.2)

## 2024-05-04 LAB — ECHOCARDIOGRAM COMPLETE
Area-P 1/2: 4.1 cm2
Height: 65 in
S' Lateral: 2.12 cm
Weight: 2084.67 [oz_av]

## 2024-05-04 LAB — HIV ANTIBODY (ROUTINE TESTING W REFLEX): HIV Screen 4th Generation wRfx: NONREACTIVE

## 2024-05-04 LAB — CREATININE, SERUM
Creatinine, Ser: 1.17 mg/dL — ABNORMAL HIGH (ref 0.44–1.00)
GFR, Estimated: 54 mL/min — ABNORMAL LOW (ref >60)

## 2024-05-04 LAB — TSH: TSH: 6.148 u[IU]/mL — ABNORMAL HIGH (ref 0.350–4.500)

## 2024-05-04 MED ORDER — CLONAZEPAM 0.5 MG PO TABS
1.0000 mg | ORAL_TABLET | Freq: Two times a day (BID) | ORAL | Status: DC
  Administered 2024-05-04 (×2): 1 mg via ORAL
  Filled 2024-05-04 (×2): qty 2

## 2024-05-04 MED ORDER — DIPHENHYDRAMINE HCL 25 MG PO CAPS
50.0000 mg | ORAL_CAPSULE | Freq: Every day | ORAL | Status: DC
  Administered 2024-05-04: 50 mg via ORAL
  Filled 2024-05-04: qty 2

## 2024-05-04 NOTE — Progress Notes (Signed)
 Eeg done

## 2024-05-04 NOTE — Discharge Summary (Signed)
 Physician Discharge Summary   Patient: Rebecca Boone MRN: [id number]  DOB: 1966/02/21   Admit:     Date of Admission: 05/03/2024 Admitted from: home   Discharge: Date of discharge: 05/04/24 Disposition: Home Condition at discharge: good  CODE STATUS: FULL CODE     Discharge Physician: Laneta Blunt, DO [facility]     PCP: Valora Agent, MD  Recommendations for Outpatient Follow-up:  Follow up with PCP Valora Agent, MD in 1-2 weeks Please obtain labs/tests: BP recheck, consider referral for tilt table testing / autonomic dysfunction if still having concern for labile blood pressure    Discharge Instructions     Diet general   Complete by: As directed    Discharge instructions   Complete by: As directed    You were seen and evaluated for spells concerning for possibly seizure, but what is much more likely is spells due to low blood pressure. We have ruled out dangerous brain/heart problems which can also cause this.   I suspect symptoms are because of blood pressure medications which are probably not necessary / unsafe given severe drop in BP. I recommend holding these medications until/unless told to restart them by your primary care team or other outpatient specialist. In general, blood pressure above goal is preferable to blood pressure that is too low. Would consider restarting medication if severe high BP - please discuss with your PCP.   Another possible explanation for symptoms/spells would be POTS (postural orthostatic tachycardia syndrome which causes rapid heart rate and drop in blood pressure usually with changes in position) which tends to be common in people with autoimmune problems, and is also more common in general post-COVID. Autonomic Dysfunction (inability to regulate blood pressure / heart rate properly) is also a consideration. Recommend your PCP look into this more if your symptoms do not resolve with reducing BP medications.   I  have discussed with our neurologist here and you do NOT need to be on anti-seizure medications at this time.   Increase activity slowly   Complete by: As directed          Discharge Diagnoses: Principal Problem:   Syncope and collapse Active Problems:   Chronic obstructive pulmonary disease (HCC)   Anxiety   Discoid lupus   History of seizure disorder   Hyperlipidemia   Hypertension   Systemic lupus erythematosus (HCC)   Undifferentiated connective tissue disease (HCC)   Hyponatremia   Hypotension      Hospital course / significant events:   HPI: Rebecca Boone is a 58 y.o. female with medical history significant of autoimmune diseases including discoid lupus, SLE, rosacea, anxiety disorder, osteoarthritis, peripheral neuropathy, essential hypertension, hyperlipidemia who was brought in by her husband secondary to recurrent passing out at home.  Patient has a remote history of seizure disorder in childhood about age 28, but not on any antiseizure medications for many years.  Per husband she started having this episode on and off for a while - they attribute this to awhile back she had to do a super prep for colonoscopy and has just not been the same since. Patient has always refused to come in to the hospital.  It usually lasts minutes and then resolves - had an episode while on this prep (possible dehydration?).  When it resolves patient is able to function, there is some grogginess but no true postictal symptoms   09/04: to ED. Patient was hypotensive today after the episode with systolic in the  70s.  In the ER her initial systolic blood pressure was also 80.  No prior workup for syncopal episode and no known cardiac disease. Admitted to hospitalist for recurrent syncope question seizure. EEG, keppra  load, echo, MRI brain. Anticipate cardio/neuro consult pending above results  09/05: MRI brain non-acute, findings c/w chronic small vessel disease. EEG no concerns. Echo no  concerns. On interview, her symptoms certainly seem c/w orthostatic hypotension / baseline hypotension on medications which is corroborated by record review. D/w neurology, no need for antiepileptics. Dc on no antihypertensives, BP has been fine without her usual amlodipine, enalapril, spiro - she will of course need close outpatient f/u      Consultants:  Informal discussion w/ neurology re: EEG, need for antiepileptics   Procedures/Surgeries: none      ASSESSMENT & PLAN:   Recurrent syncope c/w hypotension d/t overmedication, also question POTS / autonomic dysfunction in setting of multiple autoimmune problems. Have effectively ruled out seizure Hold antihypertensives Follow w/ PCP Confirmed w/ neurology no indication for antiepileptic Rx in setting of documented hypotension and reasonable explanation for symptoms other than seizure    Chronic medical problems SLE: Continue home regimen discoid lupus: Continue home regimen especially from rheumatologist Essential HTN - see above  Hyperlipidemia: Not on statin. Defer to PCP Remote history of seizure disorder: consider outpatient neurology follow up but have effectively ruled out seizure now COPD: No acute exacerbation.  anxiety disorder: Continue anxiety medications.  No concerns based on BMI: Body mass index is 21.68 kg/m.SABRA Significantly low or high BMI is associated with higher medical risk.  Underweight - under 18  overweight - 25 to 29 obese - 30 or more Class 1 obesity: BMI of 30.0 to 34 Class 2 obesity: BMI of 35.0 to 39 Class 3 obesity: BMI of 40.0 to 49 Super Morbid Obesity: BMI 50-59 Super-super Morbid Obesity: BMI 60+ Healthy nutrition and physical activity advised as adjunct to other disease management and risk reduction treatments      Discharge Instructions  Allergies as of 05/04/2024       Reactions   Sulfa Antibiotics Anaphylaxis, Other (See Comments)   Tongue swelling   Amoxicillin     Ciprofloxacin Other (See Comments)   Other reaction(s): Other (See Comments) blisters blisters   Dilantin [phenytoin Sodium Extended]    Doxycycline    Imuran  [azathioprine ] Nausea Only   Phenobarbital    Other reaction(s): Other (See Comments), Unknown   Sulfasalazine Other (See Comments)        Medication List     STOP taking these medications    amLODipine 5 MG tablet Commonly known as: NORVASC   enalapril 20 MG tablet Commonly known as: VASOTEC   spironolactone 25 MG tablet Commonly known as: ALDACTONE       TAKE these medications    clobetasol cream 0.05 % Commonly known as: TEMOVATE Apply 1 Application topically 2 (two) times daily as needed.   clonazePAM  1 MG tablet Commonly known as: KLONOPIN  Take 1 mg by mouth 2 (two) times daily.   cyanocobalamin 1000 MCG/ML injection Commonly known as: VITAMIN B12 Inject 1,000 mcg into the muscle every 30 (thirty) days.   diphenhydrAMINE  25 mg capsule Commonly known as: BENADRYL  Take 50 mg by mouth at bedtime.   ergocalciferol 1.25 MG (50000 UT) capsule Commonly known as: VITAMIN D2 Take 50,000 Units by mouth once a week. (Saturday or Sunday)   FLUoxetine  20 MG capsule Commonly known as: PROZAC  Take 20 mg by mouth daily.  gabapentin  300 MG capsule Commonly known as: NEURONTIN  Take 300-600 mg by mouth 2 (two) times daily. Take one capsule every morning and two capsules every night p.o.   metoprolol succinate 25 MG 24 hr tablet Commonly known as: TOPROL-XL Take 12.5 mg by mouth daily as needed (tachycardia).   mupirocin  ointment 2 % Commonly known as: BACTROBAN  Apply 1 Application topically 2 (two) times daily.   pantoprazole 40 MG tablet Commonly known as: PROTONIX Take 40 mg by mouth daily.         Follow-up Information     Valora Agent, MD. Schedule an appointment as soon as possible for a visit.   Specialty: Family Medicine Why: hosptial follow up - needs BP monitored / medications  adjusted, possible workup for other causes syncope or autonomic dysfunction Contact information: [address] [place] [address] [phone]                 Allergies  Allergen Reactions   Sulfa Antibiotics Anaphylaxis and Other (See Comments)    Tongue swelling   Amoxicillin    Ciprofloxacin Other (See Comments)    Other reaction(s): Other (See Comments) blisters blisters    Dilantin [Phenytoin Sodium Extended]    Doxycycline    Imuran  [Azathioprine ] Nausea Only   Phenobarbital     Other reaction(s): Other (See Comments), Unknown   Sulfasalazine Other (See Comments)     Subjective: pt feelin gbetter this afternoon, ambulating independently, tolerating diet, no further episodes    Discharge Exam: BP 132/69   Pulse 76   Temp 97.9 F (36.6 C) (Oral)   Resp 17   Ht 5' 5 (1.651 m)   Wt 59.1 kg Comment: From O/V note on 04/23/24  SpO2 98%   BMI 21.68 kg/m  General: Pt is alert, awake, not in acute distress Cardiovascular: RRR, S1/S2 +, no rubs, no gallops Respiratory: CTA bilaterally, no wheezing, no rhonchi Extremities: no edema, no cyanosis     The results of significant diagnostics from this hospitalization (including imaging, microbiology, ancillary and laboratory) are listed below for reference.     Microbiology: No results found for this or any previous visit (from the past 240 hours).   Labs: BNP (last 3 results) No results for input(s): BNP in the last 8760 hours. Basic Metabolic Panel: Recent Labs  Lab 05/03/24 0454 05/03/24 1932 05/04/24 0454  NA  --  130* 134*  K  --  4.2 3.8  CL  --  99 102  CO2  --  22 23  GLUCOSE  --  120* 118*  BUN  --  32* 30*  CREATININE 1.17* 1.56* 1.03*  CALCIUM  --  9.1 9.0   Liver Function Tests: Recent Labs  Lab 05/03/24 1932 05/04/24 0454  AST 25 21  ALT 18 15  ALKPHOS 58 59  BILITOT 0.6 0.4  PROT 7.4 6.3*  ALBUMIN 4.2 3.5   No results for input(s): LIPASE,  AMYLASE in the last 168 hours. No results for input(s): AMMONIA in the last 168 hours. CBC: Recent Labs  Lab 05/03/24 0454 05/03/24 1932 05/04/24 0454  WBC 4.8 6.7 4.7  HGB 12.2 13.6 12.3  HCT 35.2* 39.2 35.5*  MCV 88.4 87.5 88.1  PLT 167 214 164   Cardiac Enzymes: No results for input(s): CKTOTAL, CKMB, CKMBINDEX, TROPONINI in the last 168 hours. BNP: Invalid input(s): POCBNP CBG: Recent Labs  Lab 05/03/24 1930  GLUCAP 121*   D-Dimer Recent Labs    05/03/24  2133  DDIMER 0.37   Hgb A1c No results for input(s): HGBA1C in the last 72 hours. Lipid Profile No results for input(s): CHOL, HDL, LDLCALC, TRIG, CHOLHDL, LDLDIRECT in the last 72 hours. Thyroid function studies Recent Labs    05/03/24 2133  TSH 10.482*   Anemia work up No results for input(s): VITAMINB12, FOLATE, FERRITIN, TIBC, IRON, RETICCTPCT in the last 72 hours. Urinalysis    Component Value Date/Time   COLORURINE YELLOW 05/02/2020 1228   APPEARANCEUR HAZY (A) 05/02/2020 1228   LABSPEC 1.025 05/02/2020 1228   PHURINE 6.0 05/02/2020 1228   GLUCOSEU NEGATIVE 05/02/2020 1228   HGBUR NEGATIVE 05/02/2020 1228   BILIRUBINUR NEGATIVE 05/02/2020 1228   KETONESUR NEGATIVE 05/02/2020 1228   PROTEINUR NEGATIVE 05/02/2020 1228   NITRITE NEGATIVE 05/02/2020 1228   LEUKOCYTESUR TRACE (A) 05/02/2020 1228   Sepsis Labs Recent Labs  Lab 05/03/24 0454 05/03/24 1932 05/04/24 0454  WBC 4.8 6.7 4.7   Microbiology No results found for this or any previous visit (from the past 240 hours). Imaging EEG adult Result Date: 05/04/2024 Michaela Aisha SQUIBB, MD     05/04/2024  4:03 PM History: 58 year old female with history of seizures, episodes concerning for syncope.  EEG being done to evaluate for seizures. EEG Duration: 21 minutes Sedation: None Patient State: Awake and drowsy Technique: This EEG was acquired with electrodes placed according to the International 10-20  electrode system (including Fp1, Fp2, F3, F4, C3, C4, P3, P4, O1, O2, T3, T4, T5, T6, A1, A2, Fz, Cz, Pz). The following electrodes were missing or displaced: none. Background: The background consists of intermixed alpha and beta activities. There is a well defined posterior dominant rhythm of 10 hz that attenuates with eye opening. Sleep is not recorded.  There was no epileptiform discharge during the study. Photic stimulation: Physiologic driving is not performed EEG Abnormalities: None Clinical Interpretation: This normal EEG is recorded in the waking and drowsy state. There was no seizure or seizure predisposition recorded on this study. Please note that lack of epileptiform activity on EEG does not preclude the possibility of epilepsy. Aisha Michaela, MD [facility] If 7pm- 7am, please page neurology on call as listed in AMION.  ECHOCARDIOGRAM COMPLETE Result Date: 05/04/2024    ECHOCARDIOGRAM REPORT   Patient Name:   Rebecca Boone Date of Exam: 05/04/2024 Medical Rec #:  [id number]          Height:       65.0 in Accession #:    [id number]         Weight:       130.3 lb Date of Birth:  1966/04/18          BSA:          1.649 m Patient Age:    58 years           BP:           118/64 mmHg Patient Gender: F                  HR:           54 bpm. Exam Location:  [facility] Procedure: 2D Echo, Cardiac Doppler and Color Doppler (Both Spectral and Color            Flow Doppler were utilized during procedure). Indications:     Syncope  History:         Patient has no prior history of Echocardiogram examinations.  Signs/Symptoms:Hypotension; Risk Factors:Hypertension and                  Dyslipidemia.  Sonographer:     Philomena Daring Referring Phys:  [id number] EMERY LITTIE FUSS Diagnosing Phys: Redell Cave MD IMPRESSIONS  1. Left ventricular ejection fraction, by estimation, is 60 to 65%. The left ventricle has normal function. The left ventricle has no regional wall motion abnormalities.  Left ventricular diastolic parameters were normal.  2. Right ventricular systolic function is normal. The right ventricular size is normal.  3. The mitral valve is normal in structure. No evidence of mitral valve regurgitation.  4. The aortic valve is normal in structure. Aortic valve regurgitation is mild.  5. The inferior vena cava is normal in size with greater than 50% respiratory variability, suggesting right atrial pressure of 3 mmHg. FINDINGS  Left Ventricle: Left ventricular ejection fraction, by estimation, is 60 to 65%. The left ventricle has normal function. The left ventricle has no regional wall motion abnormalities. The left ventricular internal cavity size was normal in size. There is  no left ventricular hypertrophy. Left ventricular diastolic parameters were normal. Right Ventricle: The right ventricular size is normal. No increase in right ventricular wall thickness. Right ventricular systolic function is normal. Left Atrium: Left atrial size was normal in size. Right Atrium: Right atrial size was normal in size. Pericardium: There is no evidence of pericardial effusion. Mitral Valve: The mitral valve is normal in structure. No evidence of mitral valve regurgitation. Tricuspid Valve: The tricuspid valve is normal in structure. Tricuspid valve regurgitation is trivial. Aortic Valve: The aortic valve is normal in structure. Aortic valve regurgitation is mild. Pulmonic Valve: The pulmonic valve was normal in structure. Pulmonic valve regurgitation is trivial. Aorta: The aortic root and ascending aorta are structurally normal, with no evidence of dilitation. Venous: The inferior vena cava is normal in size with greater than 50% respiratory variability, suggesting right atrial pressure of 3 mmHg. IAS/Shunts: No atrial level shunt detected by color flow Doppler.  LEFT VENTRICLE PLAX 2D LVIDd:         3.33 cm   Diastology LVIDs:         2.12 cm   LV e' medial:    7.35 cm/s LV PW:         1.04 cm   LV E/e'  medial:  10.7 LV IVS:        1.11 cm   LV e' lateral:   10.60 cm/s LVOT diam:     1.80 cm   LV E/e' lateral: 7.4 LV SV:         73 LV SV Index:   44 LVOT Area:     2.54 cm  RIGHT VENTRICLE             IVC RV S prime:     11.70 cm/s  IVC diam: 1.31 cm TAPSE (M-mode): 1.7 cm LEFT ATRIUM             Index        RIGHT ATRIUM           Index LA diam:        2.90 cm 1.76 cm/m   RA Area:     10.40 cm LA Vol (A2C):   24.4 ml 14.80 ml/m  RA Volume:   19.60 ml  11.89 ml/m LA Vol (A4C):   31.7 ml 19.23 ml/m LA Biplane Vol: 27.3 ml 16.56 ml/m  AORTIC VALVE LVOT Vmax:   142.00  cm/s LVOT Vmean:  90.500 cm/s LVOT VTI:    0.286 m  AORTA Ao Root diam: 2.80 cm Ao Asc diam:  2.90 cm MITRAL VALVE               TRICUSPID VALVE MV Area (PHT): 4.10 cm    TR Peak grad:   17.3 mmHg MV Decel Time: 185 msec    TR Vmax:        208.00 cm/s MV E velocity: 78.60 cm/s MV A velocity: 75.20 cm/s  SHUNTS MV E/A ratio:  1.05        Systemic VTI:  0.29 m                            Systemic Diam: 1.80 cm Redell Cave MD Electronically signed by Redell Cave MD Signature Date/Time: 05/04/2024/3:45:12 PM    Final       Time coordinating discharge: over 30 minutes  SIGNED:  Laneta Blunt DO [facility]

## 2024-05-04 NOTE — Progress Notes (Incomplete)
 PROGRESS NOTE    [name]   FMW:[id number] DOB: [birth year]  DOA: 05/03/2024 Date of Service: 05/04/24 which is hospital day 1  PCP: Valora Agent, MD    Hospital course / significant events:   HPI: Rebecca Boone is a 58 y.o. female with medical history significant of autoimmune diseases including discoid lupus, SLE, rosacea, anxiety disorder, osteoarthritis, peripheral neuropathy, essential hypertension, hyperlipidemia who was brought in by her husband secondary to recurrent passing out at home.  Patient has a remote history of seizure disorder but not on any antiseizure medications.  Per husband she started having this episode on and off for a while.  Patient has always refused to come in to the hospital.  It usually lasts minutes and then resolves.  When it resolves patient is able to function.  Denied any postictal status.  Patient was hypotensive today after the episode with systolic in the 70s.  In the ER her initial systolic blood pressure was also 80.  No prior workup for syncopal episode and no known cardiac disease.   09/04: to ED. Admitted to hospitalist for recurrent syncope/seizure. EEG, keppra  load, echo, MRI brain. Anticipate cardio/neuro consult pending above results  09/05: MRI brain non-acute, findings c/w chronic small vessel disease. EEG ***. Echo ***.      Consultants:  ***  Procedures/Surgeries: ***      ASSESSMENT & PLAN:       *** based on BMI: Body mass index is 21.68 kg/m.SABRA Significantly low or high BMI is associated with higher medical risk.  Underweight - under 18  overweight - 25 to 29 obese - 30 or more Class 1 obesity: BMI of 30.0 to 34 Class 2 obesity: BMI of 35.0 to 39 Class 3 obesity: BMI of 40.0 to 49 Super Morbid Obesity: BMI 50-59 Super-super Morbid Obesity: BMI 60+ Healthy nutrition and physical activity advised as adjunct to other disease management and risk reduction treatments    DVT prophylaxis: *** IV fluids:  *** continuous IV fluids  Nutrition: *** Central lines / other devices: ***  Code Status: *** ACP documentation reviewed: *** none on file in [facility]  TOC needs: *** Medical barriers to dispo: ***. Expected medical readiness for discharge ***.              Subjective / Brief ROS:  Patient reports *** Denies CP/SOB.  Pain controlled.  Denies new weakness.  Tolerating diet ***.  Reports no concerns w/ urination/defecation.   Family Communication: ***    Objective Findings:  Vitals:   05/04/24 0118 05/04/24 0300 05/04/24 0512 05/04/24 0747  BP: (!) 154/62 112/62 113/69 108/77  Pulse: (!) 57 71 (!) 58 63  Resp: 17 16 19  (!) 30  Temp: 97.6 F (36.4 C)  97.6 F (36.4 C) 97.8 F (36.6 C)  TempSrc: Axillary  Oral Oral  SpO2: 100% 100% 100% 100%  Weight:      Height:        Intake/Output Summary (Last 24 hours) at 05/04/2024 0917 Last data filed at 05/03/2024 2225 Gross per 24 hour  Intake 1000 ml  Output --  Net 1000 ml   Filed Weights   05/03/24 2332  Weight: 59.1 kg    Examination:  Physical Exam       Scheduled Medications:   clonazePAM   1 mg Oral BID   diphenhydrAMINE   50 mg Oral QHS   enoxaparin  (LOVENOX ) injection  40 mg Subcutaneous Q24H   FLUoxetine   20 mg Oral Daily  gabapentin   300 mg Oral Daily   And   gabapentin   600 mg Oral QHS   levETIRAcetam   750 mg Oral BID   sodium chloride  flush  3 mL Intravenous Q12H    Continuous Infusions:  sodium chloride  100 mL/hr at 05/04/24 0128    PRN Medications:  ondansetron  **OR** ondansetron  (ZOFRAN ) IV  Antimicrobials from admission:  Anti-infectives (From admission, onward)    None           Data Reviewed:  I have personally reviewed the following...  CBC: Recent Labs  Lab 05/03/24 0454 05/03/24 1932 05/04/24 0454  WBC 4.8 6.7 4.7  HGB 12.2 13.6 12.3  HCT 35.2* 39.2 35.5*  MCV 88.4 87.5 88.1  PLT 167 214 164   Basic Metabolic Panel: Recent Labs  Lab 05/03/24 0454  05/03/24 1932 05/04/24 0454  NA  --  130* 134*  K  --  4.2 3.8  CL  --  99 102  CO2  --  22 23  GLUCOSE  --  120* 118*  BUN  --  32* 30*  CREATININE 1.17* 1.56* 1.03*  CALCIUM  --  9.1 9.0   GFR: Estimated Creatinine Clearance: 52.9 mL/min (A) (by C-G formula based on SCr of 1.03 mg/dL (H)). Liver Function Tests: Recent Labs  Lab 05/03/24 1932 05/04/24 0454  AST 25 21  ALT 18 15  ALKPHOS 58 59  BILITOT 0.6 0.4  PROT 7.4 6.3*  ALBUMIN 4.2 3.5   No results for input(s): LIPASE, AMYLASE in the last 168 hours. No results for input(s): AMMONIA in the last 168 hours. Coagulation Profile: No results for input(s): INR, PROTIME in the last 168 hours. Cardiac Enzymes: No results for input(s): CKTOTAL, CKMB, CKMBINDEX, TROPONINI in the last 168 hours. BNP (last 3 results) No results for input(s): PROBNP in the last 8760 hours. HbA1C: No results for input(s): HGBA1C in the last 72 hours. CBG: Recent Labs  Lab 05/03/24 1930  GLUCAP 121*   Lipid Profile: No results for input(s): CHOL, HDL, LDLCALC, TRIG, CHOLHDL, LDLDIRECT in the last 72 hours. Thyroid Function Tests: Recent Labs    05/03/24 2133  TSH 10.482*  FREET4 0.96   Anemia Panel: No results for input(s): VITAMINB12, FOLATE, FERRITIN, TIBC, IRON, RETICCTPCT in the last 72 hours. Most Recent Urinalysis On File:     Component Value Date/Time   COLORURINE YELLOW 05/02/2020 1228   APPEARANCEUR HAZY (A) 05/02/2020 1228   LABSPEC 1.025 05/02/2020 1228   PHURINE 6.0 05/02/2020 1228   GLUCOSEU NEGATIVE 05/02/2020 1228   HGBUR NEGATIVE 05/02/2020 1228   BILIRUBINUR NEGATIVE 05/02/2020 1228   KETONESUR NEGATIVE 05/02/2020 1228   PROTEINUR NEGATIVE 05/02/2020 1228   NITRITE NEGATIVE 05/02/2020 1228   LEUKOCYTESUR TRACE (A) 05/02/2020 1228   Sepsis Labs: @LABRCNTIP (procalcitonin:4,lacticidven:4) Microbiology: No results found for this or any previous visit (from the  past 240 hours).    Radiology Studies last 3 days: MR BRAIN WO CONTRAST Result Date: 05/03/2024 EXAM: MRI BRAIN WITHOUT CONTRAST 05/03/2024 11:19:06 PM TECHNIQUE: Multiplanar multisequence MRI of the head/brain was performed without the administration of intravenous contrast. COMPARISON: None available. CLINICAL HISTORY: Syncope/presyncope, cerebrovascular cause suspected. Per MD: 58 y.o. female discoid lupus, SLE, MRSA infection, hypertension, hyperlipidemia, and rosacea who presents after an episode of unresponsiveness. Patient states that she was on the toilet after having gas pains and while she tried to have a BM; became unresponsive. She did not hit her head. Patient reports that she has been having syncopal episodes or episodes  of unresponsiveness since March of this year. She has followed up with her primary care physician who referred her to pulmonology; but has never followed up yet with her cardiologist. She does report that she has had fluctuating blood pressures over the past several months as well as low as 72/59. She does report a prior history of seizures in adolescence and young adulthood and; has been off all antiepileptics for several years. She does have a mild headache but denies any hearing or vision changes. FINDINGS: BRAIN AND VENTRICLES: No acute infarct. No intracranial hemorrhage. No mass. No midline shift. No hydrocephalus. The sella is unremarkable. Normal flow voids. Multifocal hyperintense T2-weighted signal within the cerebral white matter, most commonly due to chronic small vessel disease. ORBITS: No acute abnormality. SINUSES AND MASTOIDS: No acute abnormality. BONES AND SOFT TISSUES: Normal marrow signal. No acute soft tissue abnormality. IMPRESSION: 1. No acute intracranial abnormality. 2. Multifocal hyperintense T2-weighted signal within the cerebral white matter, most commonly due to chronic small vessel disease. Electronically signed by: Franky Stanford MD 05/03/2024 11:46 PM  EDT RP Workstation: [id number]   DG Chest 2 View Result Date: 05/03/2024 CLINICAL DATA:  Syncope, seizure EXAM: CHEST - 2 VIEW COMPARISON:  None Available. FINDINGS: The heart size and mediastinal contours are within normal limits. Both lungs are clear. The visualized skeletal structures are unremarkable. IMPRESSION: No active cardiopulmonary disease. Electronically Signed   By: Dorethia Molt M.D.   On: 05/03/2024 21:38   CT Head Wo Contrast Result Date: 05/03/2024 CLINICAL DATA:  Recurrent syncope, seizure EXAM: CT HEAD WITHOUT CONTRAST TECHNIQUE: Contiguous axial images were obtained from the base of the skull through the vertex without intravenous contrast. RADIATION DOSE REDUCTION: This exam was performed according to the departmental dose-optimization program which includes automated exposure control, adjustment of the mA and/or kV according to patient size and/or use of iterative reconstruction technique. COMPARISON:  None Available. FINDINGS: Brain: No evidence of acute infarction, hemorrhage, hydrocephalus, extra-axial collection or mass lesion/mass effect. Vascular: No hyperdense vessel or unexpected calcification. Skull: Normal. Negative for fracture or focal lesion. Sinuses/Orbits: No acute finding. Other: Mastoid air cells and middle ear cavities are clear. IMPRESSION: 1. No acute intracranial abnormality. Electronically Signed   By: Dorethia Molt M.D.   On: 05/03/2024 21:21       Time spent: ***    Laneta Blunt, DO [facility] 05/04/2024, 9:17 AM    Dictation software may have been used to generate the above note. Typos may occur and escape review in typed/dictated notes. Please contact Dr Blunt directly for clarity if needed.  Staff may message me via secure chat in Epic  but this may not receive an immediate response,  please page me for urgent matters!  If 7PM-7AM, please contact night coverage [email or web address]

## 2024-05-04 NOTE — Procedures (Signed)
 History: 58 year old female with history of seizures, episodes concerning for syncope.  EEG being done to evaluate for seizures.  EEG Duration: 21 minutes  Sedation: None  Patient State: Awake and drowsy  Technique: This EEG was acquired with electrodes placed according to the International 10-20 electrode system (including Fp1, Fp2, F3, F4, C3, C4, P3, P4, O1, O2, T3, T4, T5, T6, A1, A2, Fz, Cz, Pz). The following electrodes were missing or displaced: none.   Background: The background consists of intermixed alpha and beta activities. There is a well defined posterior dominant rhythm of 10 hz that attenuates with eye opening. Sleep is not recorded.  There was no epileptiform discharge during the study.  Photic stimulation: Physiologic driving is not performed  EEG Abnormalities: None  Clinical Interpretation: This normal EEG is recorded in the waking and drowsy state. There was no seizure or seizure predisposition recorded on this study. Please note that lack of epileptiform activity on EEG does not preclude the possibility of epilepsy.   Aisha Seals, MD [facility]   If 7pm- 7am, please page neurology on call as listed in AMION.

## 2024-05-04 NOTE — Hospital Course (Signed)
 Hospital course / significant events:   HPI: Rebecca Boone is a 58 y.o. female with medical history significant of autoimmune diseases including discoid lupus, SLE, rosacea, anxiety disorder, osteoarthritis, peripheral neuropathy, essential hypertension, hyperlipidemia who was brought in by her husband secondary to recurrent passing out at home.  Patient has a remote history of seizure disorder but not on any antiseizure medications.  Per husband she started having this episode on and off for a while.  Patient has always refused to come in to the hospital.  It usually lasts minutes and then resolves.  When it resolves patient is able to function.  Denied any postictal status.  Patient was hypotensive today after the episode with systolic in the 70s.  In the ER her initial systolic blood pressure was also 80.  No prior workup for syncopal episode and no known cardiac disease.   09/04: to ED. Admitted to hospitalist for recurrent syncope/seizure. EEG, keppra  load, echo, MRI brain. Anticipate cardio/neuro consult pending above results  09/05: MRI brain non-acute, findings c/w chronic small vessel disease. EEG ***. Echo ***.      Consultants:  ***  Procedures/Surgeries: ***      ASSESSMENT & PLAN:       *** based on BMI: Body mass index is 21.68 kg/m.SABRA Significantly low or high BMI is associated with higher medical risk.  Underweight - under 18  overweight - 25 to 29 obese - 30 or more Class 1 obesity: BMI of 30.0 to 34 Class 2 obesity: BMI of 35.0 to 39 Class 3 obesity: BMI of 40.0 to 49 Super Morbid Obesity: BMI 50-59 Super-super Morbid Obesity: BMI 60+ Healthy nutrition and physical activity advised as adjunct to other disease management and risk reduction treatments    DVT prophylaxis: *** IV fluids: *** continuous IV fluids  Nutrition: *** Central lines / other devices: ***  Code Status: *** ACP documentation reviewed: *** none on file in [facility]  TOC needs:  *** Medical barriers to dispo: ***. Expected medical readiness for discharge ***.

## 2024-05-04 NOTE — ED Notes (Signed)
 Pt up to restroom.

## 2024-05-26 ENCOUNTER — Encounter: Payer: Self-pay | Admitting: Emergency Medicine

## 2024-05-26 ENCOUNTER — Ambulatory Visit: Admission: EM | Admit: 2024-05-26 | Discharge: 2024-05-26 | Disposition: A | Discharge status: Home / Self Care

## 2024-05-26 DIAGNOSIS — R21 Rash and other nonspecific skin eruption: Secondary | ICD-10-CM

## 2024-05-26 DIAGNOSIS — H6122 Impacted cerumen, left ear: Secondary | ICD-10-CM

## 2024-05-26 MED ORDER — METHYLPREDNISOLONE 4 MG PO TBPK: ORAL_TABLET | ORAL | 0 refills | Status: AC

## 2024-05-26 NOTE — ED Triage Notes (Signed)
 Patient reports a burning red rash on the left side of her face and behind her left ear for the past 3 days.  Patient unsure of fevers.

## 2024-05-26 NOTE — ED Provider Notes (Signed)
 [facility]    CSN: [id number] Arrival date & time: 05/26/24  1421      History   Chief Complaint Chief Complaint  Patient presents with   Rash    HPI Rebecca Boone is a 59 y.o. female.   HPI  58 year old female with past medical history significant for discoid lupus, hypertension, hyperlipidemia, anxiety, COPD, and SLE presents for evaluation of a burning red rash on the left side of her face and behind her left ear that started 3 days ago.  No associated fever, vesicles, burning.  She is also complaining of earwax accumulation in the left ear that she has been unable to remove at home using home earwax removal kits.  Past Medical History:  Diagnosis Date   Anxiety    Arthritis    Discoid lupus    Hyperlipidemia    Hypertension    Lupus (systemic lupus erythematosus) (HCC)    MRSA (methicillin resistant staph aureus) culture positive    Neuropathy    Rosacea    Seizures (HCC)     Patient Active Problem List   Diagnosis Date Noted   Anxiety 05/03/2024   Hyperlipidemia 05/03/2024   Hypertension 05/03/2024   Syncope and collapse 05/03/2024   Hyponatremia 05/03/2024   Hypotension 05/03/2024   Chronic obstructive pulmonary disease (HCC) 04/11/2024   Systemic lupus erythematosus (HCC) 09/09/2023   Acute URI 06/28/2023   Sinus pressure 06/28/2023   Acute cough 06/28/2023   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      Home Medications    Prior to Admission medications   Medication Sig Start Date End Date Taking? Authorizing  Provider  enalapril (VASOTEC) 20 MG tablet Take 20 mg by mouth at bedtime. 05/09/18 05/22/25 Yes [provider]  methylPREDNISolone (MEDROL DOSEPAK) 4 MG TBPK tablet Take according to the package insert. 05/26/24  Yes Bernardino Ditch, NP  spironolactone (ALDACTONE) 25 MG tablet Take 25 mg by mouth daily. 05/22/24 05/22/25 Yes [provider]  clobetasol cream (TEMOVATE) 0.05 % Apply 1 Application topically 2 (two) times daily as needed. 02/03/16   [provider]  clonazePAM  (KLONOPIN ) 1 MG tablet Take 1 mg by mouth 2 (two) times daily.    [provider]  cyanocobalamin (,VITAMIN B-12,) 1000 MCG/ML injection Inject 1,000 mcg into the muscle every 30 (thirty) days.    [provider]  diphenhydrAMINE  (BENADRYL ) 25 mg capsule Take 50 mg by mouth at bedtime.    [provider]  ergocalciferol (VITAMIN D2) 50000 UNITS capsule Take 50,000 Units by mouth once a week. (Saturday or Sunday)    [provider]  FLUoxetine  (PROZAC ) 20 MG capsule Take 20 mg by mouth daily.    [provider]  gabapentin  (NEURONTIN ) 300 MG capsule Take 300-600 mg by mouth 2 (two) times daily. Take one capsule every morning and two capsules every night p.o.    [provider]  pantoprazole (PROTONIX) 40 MG tablet Take 40 mg by mouth daily.    [provider]    Family History Family History  Problem Relation Age of Onset   Congestive Heart Failure Mother    Diabetes Mother    Hypertension Mother    Cancer Father    Hypertension Father    Hyperlipidemia Father     Social History Social History   Tobacco Use   Smoking status: Former    Current packs/day: 0.00    Types: Cigarettes    Quit date: 07/21/2009    Years since quitting: 14.8   Smokeless tobacco: Never  Vaping Use   Vaping status: Never Used  Substance Use Topics   Alcohol use: Yes    Comment: social   Drug use: No     Allergies   Sulfa antibiotics, Amoxicillin,  Ciprofloxacin, Dilantin [phenytoin sodium extended], Doxycycline, Imuran  [azathioprine ], Phenobarbital, and Sulfasalazine   Review of Systems Review of Systems  HENT:         Earwax buildup in left ear  Skin:  Positive for rash.     Physical Exam Triage Vital Signs ED Triage Vitals [05/26/24 1435]  Encounter Vitals Group     BP      Girls Systolic BP Percentile      Girls Diastolic BP Percentile      Boys Systolic BP Percentile      Boys Diastolic BP Percentile      Pulse      Resp      Temp      Temp src      SpO2      Weight 130 lb 4.7 oz (59.1 kg)     Height 5' 5 (1.651 m)     Head Circumference      Peak Flow      Pain Score 6     Pain Loc      Pain Education      Exclude from Growth Chart    No data found.  Updated Vital Signs BP (!) 182/75 (BP Location: Left Arm)   Pulse (!) 54   Temp 98.1 F (36.7 C) (Oral)   Resp 14   Ht 5' 5 (1.651 m)   Wt 130 lb 4.7 oz (59.1 kg)   SpO2 96%   BMI 21.68 kg/m   Visual Acuity Right Eye Distance:   Left Eye Distance:   Bilateral Distance:    Right Eye Near:   Left Eye Near:    Bilateral Near:     Physical Exam Vitals and nursing note reviewed.  Constitutional:      Appearance: Normal appearance.  HENT:     Head: Normocephalic and atraumatic.     Right Ear: Tympanic membrane, ear canal and external ear normal.     Left Ear: External ear normal. There is impacted cerumen.  Musculoskeletal:     Cervical back: Normal range of motion and neck supple. No tenderness.  Lymphadenopathy:     Cervical: No cervical adenopathy.  Skin:    General: Skin is warm and dry.     Capillary Refill: Capillary refill takes less than 2 seconds.     Findings: Erythema and rash present.  Neurological:     General: No focal deficit present.     Mental Status: She is alert and oriented to person, place, and time.      UC Treatments / Results  Labs (all labs ordered are listed, but only abnormal results are displayed) Labs  Reviewed - No data to display  EKG   Radiology No results found.  Procedures Procedures (including critical care time)  Medications Ordered in  UC Medications - No data to display  Initial Impression / Assessment and Plan / UC Course  I have reviewed the triage vital signs and the nursing notes.  Pertinent labs & imaging results that were available during my care of the patient were reviewed by me and considered in my medical decision making (see chart for details).   Patient is a pleasant, nontoxic-appearing 58 year old female presenting for evaluation of 2 separate complaints.  Her first complaint is that she has an accumulation of earwax in the left ear that she has been unable to remove with home treatments.  She is requesting lavage.  Otoscopic examination does reveal cerumen impaction in the left EAC.  The right EAC is clear. The patient's second complaint is a burning, itching, rash on the left side of her face with some left-sided facial swelling as well as behind her left ear and on the posterior right side of her neck.  She does have a history of discoid lupus and psoriasis.    The lesions on the left side of the face and behind the ear may be discoid lupus or they may be psoriasis.  They are macular, erythematous, with scaly skin over top.  The lesion on the posterior aspect of the neck on the right may be discoid lupus as it is more coin shaped, erythematous, scaly lesion.  Due to the fact that the majority of the lesions on her face, not prescribe a topical steroid.  The patient reports that she does not do well on prednisone as it causes mood swings so we will do a trial of Medrol that she can start tomorrow morning.  I did offer her an injection of Decadron here in clinic but she says she would prefer to start the oral tablets tomorrow morning.  Following ear lavage the patient's left external auditory canal is clear.  She reports that she feels much better and her hearing  has improved.  I will discharge patient home with a diagnosis of rash and cerumen impaction.  I will start her on Medrol Dosepak and she may use over-the-counter antihistamines as needed for itching.  Return precautions reviewed.   Final Clinical Impressions(s) / UC Diagnoses   Final diagnoses:  Rash and nonspecific skin eruption  Impacted cerumen of left ear     Discharge Instructions      The rash on your face is suspicious for possible psoriasis.  Starting tomorrow morning begin taking the Medrol Dosepak according to the package instructions.  He will take this over the next 6 days.  This will help decrease inflammation and itching and burning.  You may also use over-the-counter Claritin, Zyrtec, or Allegra during the day to help with any itching and Benadryl  at bedtime.     ED Prescriptions     Medication Sig Dispense Auth. Provider   methylPREDNISolone (MEDROL DOSEPAK) 4 MG TBPK tablet Take according to the package insert. 1 each Bernardino Ditch, NP      PDMP not reviewed this encounter.   Bernardino Ditch, NP 05/26/24 1527

## 2024-05-26 NOTE — Discharge Instructions (Addendum)
 The rash on your face is suspicious for possible psoriasis.  Starting tomorrow morning begin taking the Medrol Dosepak according to the package instructions.  He will take this over the next 6 days.  This will help decrease inflammation and itching and burning.  You may also use over-the-counter Claritin, Zyrtec, or Allegra during the day to help with any itching and Benadryl  at bedtime.

## 2024-05-30 ENCOUNTER — Ambulatory Visit
Admission: EM | Admit: 2024-05-30 | Discharge: 2024-05-30 | Disposition: A | Discharge status: Home / Self Care | Attending: Physician Assistant | Admitting: Physician Assistant

## 2024-05-30 DIAGNOSIS — M329 Systemic lupus erythematosus, unspecified: Secondary | ICD-10-CM | POA: Diagnosis not present

## 2024-05-30 DIAGNOSIS — I10 Essential (primary) hypertension: Secondary | ICD-10-CM | POA: Diagnosis not present

## 2024-05-30 DIAGNOSIS — Z8614 Personal history of Methicillin resistant Staphylococcus aureus infection: Secondary | ICD-10-CM | POA: Diagnosis not present

## 2024-05-30 DIAGNOSIS — L03211 Cellulitis of face: Secondary | ICD-10-CM | POA: Diagnosis not present

## 2024-05-30 MED ORDER — CLINDAMYCIN HCL 150 MG PO CAPS: 450.0000 mg | ORAL_CAPSULE | Freq: Four times a day (QID) | ORAL | 0 refills | Status: AC

## 2024-05-30 MED ORDER — MUPIROCIN 2 % EX OINT: 1.0000 | TOPICAL_OINTMENT | Freq: Two times a day (BID) | CUTANEOUS | 0 refills | Status: AC

## 2024-05-30 NOTE — ED Provider Notes (Signed)
 [facility]    CSN: [id number] Arrival date & time: 05/30/24  1328      History   Chief Complaint Chief Complaint  Patient presents with   Rash    HPI Rebecca Boone is a 58 y.o. female presenting for 1 week history of swelling/redness to the left side of her face. She was seen in this department 4 days ago and treated with  medrol which has not helped. Symptoms have actually worsened. She now reports thin light yellow drainage from the area and increased swelling. No fevers. She does report headaches. No dizziness, weakness, chest pain or SOB. BP is high at 176/85. She takes enalapril.   Medical history is significant for Lupus, MRSA, hypertension, hyperlipidemia, rosacea, neuropathy, COPD, and seizures.   HPI  Past Medical History:  Diagnosis Date   Anxiety    Arthritis    Discoid lupus    Hyperlipidemia    Hypertension    Lupus (systemic lupus erythematosus) (HCC)    MRSA (methicillin resistant staph aureus) culture positive    Neuropathy    Rosacea    Seizures (HCC)     Patient Active Problem List   Diagnosis Date Noted   Anxiety 05/03/2024   Hyperlipidemia 05/03/2024   Hypertension 05/03/2024   Syncope and collapse 05/03/2024   Hyponatremia 05/03/2024   Hypotension 05/03/2024   Chronic obstructive pulmonary disease (HCC) 04/11/2024   Systemic lupus erythematosus (HCC) 09/09/2023   Acute URI 06/28/2023   Sinus pressure 06/28/2023   Acute cough 06/28/2023   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      Home Medications     Prior to Admission medications   Medication Sig Start Date End Date Taking? Authorizing Provider  clindamycin  (CLEOCIN ) 150 MG capsule Take 3 capsules (450 mg total) by mouth 4 (four) times daily for 7 days. 05/30/24 06/06/24 Yes Arvis Jolan NOVAK, PA-C  mupirocin  ointment (BACTROBAN ) 2 % Apply 1 Application topically 2 (two) times daily. 05/30/24  Yes Arvis Jolan B, PA-C  clobetasol cream (TEMOVATE) 0.05 % Apply 1 Application topically 2 (two) times daily as needed. 02/03/16   [provider]  clonazePAM  (KLONOPIN ) 1 MG tablet Take 1 mg by mouth 2 (two) times daily.    [provider]  cyanocobalamin (,VITAMIN B-12,) 1000 MCG/ML injection Inject 1,000 mcg into the muscle every 30 (thirty) days.    [provider]  diphenhydrAMINE  (BENADRYL ) 25 mg capsule Take 50 mg by mouth at bedtime.    [provider]  enalapril (VASOTEC) 20 MG tablet Take 20 mg by mouth at bedtime. 05/09/18 05/22/25  [provider]  ergocalciferol (VITAMIN D2) 50000 UNITS capsule Take 50,000 Units by mouth once a week. (Saturday or Sunday)    [provider]  FLUoxetine  (PROZAC ) 20 MG capsule Take 20 mg by mouth daily.    [provider]  gabapentin  (NEURONTIN ) 300 MG capsule Take 300-600 mg by mouth 2 (two) times daily. Take one capsule every morning and two capsules every night p.o.    [provider]  methylPREDNISolone (  MEDROL DOSEPAK) 4 MG TBPK tablet Take according to the package insert. 05/26/24   Bernardino Ditch, NP  pantoprazole (PROTONIX) 40 MG tablet Take 40 mg by mouth daily.    [provider]  spironolactone (ALDACTONE) 25 MG tablet Take 25 mg by mouth daily. 05/22/24 05/22/25  [provider]    Family History Family History  Problem Relation Age of Onset   Congestive Heart Failure Mother    Diabetes Mother    Hypertension Mother    Cancer Father    Hypertension Father    Hyperlipidemia Father     Social History Social  History   Tobacco Use   Smoking status: Former    Current packs/day: 0.00    Types: Cigarettes    Quit date: 07/21/2009    Years since quitting: 14.8   Smokeless tobacco: Never  Vaping Use   Vaping status: Never Used  Substance Use Topics   Alcohol use: Yes    Comment: social   Drug use: No     Allergies   Amoxicillin, Sulfa antibiotics, Ciprofloxacin, Dilantin [phenytoin sodium extended], Doxycycline, Imuran  [azathioprine ], Phenobarbital, and Sulfasalazine   Review of Systems Review of Systems  Constitutional:  Negative for fatigue.  Respiratory:  Negative for shortness of breath.   Cardiovascular:  Negative for chest pain and palpitations.  Skin:  Positive for rash.  Neurological:  Positive for headaches. Negative for dizziness and weakness.     Physical Exam Triage Vital Signs ED Triage Vitals  Encounter Vitals Group     BP 05/30/24 1334 (!) 176/85     Girls Systolic BP Percentile --      Girls Diastolic BP Percentile --      Boys Systolic BP Percentile --      Boys Diastolic BP Percentile --      Pulse Rate 05/30/24 1334 75     Resp 05/30/24 1334 16     Temp 05/30/24 1334 98.1 F (36.7 C)     Temp Source 05/30/24 1334 Oral     SpO2 05/30/24 1334 99 %     Weight 05/30/24 1333 130 lb 4.7 oz (59.1 kg)     Height 05/30/24 1333 5' 5 (1.651 m)     Head Circumference --      Peak Flow --      Pain Score 05/30/24 1336 10     Pain Loc --      Pain Education --      Exclude from Growth Chart --    No data found.  Updated Vital Signs BP (!) 176/85 (BP Location: Left Arm)   Pulse 75   Temp 98.1 F (36.7 C) (Oral)   Resp 16   Ht 5' 5 (1.651 m)   Wt 130 lb 4.7 oz (59.1 kg)   SpO2 99%   BMI 21.68 kg/m     Physical Exam Vitals and nursing note reviewed.  Constitutional:      General: She is not in acute distress.    Appearance: Normal appearance. She is not ill-appearing or toxic-appearing.  HENT:     Head: Normocephalic and atraumatic.     Right  Ear: Tympanic membrane, ear canal and external ear normal.     Nose: Nose normal.     Mouth/Throat:     Mouth: Mucous membranes are moist.     Pharynx: Oropharynx is clear.  Eyes:     General: No scleral icterus.       Right eye: No discharge.  Left eye: No discharge.     Conjunctiva/sclera: Conjunctivae normal.  Cardiovascular:     Rate and Rhythm: Normal rate and regular rhythm.     Heart sounds: Normal heart sounds.  Pulmonary:     Effort: Pulmonary effort is normal. No respiratory distress.     Breath sounds: Normal breath sounds.  Musculoskeletal:     Cervical back: Neck supple.  Skin:    General: Skin is dry.     Findings: Erythema and rash present.     Comments: See images included in chart. There is erythema/swelling/induration of the left preauricular area. Weeping/drainage along lesion behind hairline. Area is diffusely tender.   Neurological:     General: No focal deficit present.     Mental Status: She is alert. Mental status is at baseline.     Motor: No weakness.     Gait: Gait normal.  Psychiatric:        Mood and Affect: Mood normal.        Behavior: Behavior normal.         UC Treatments / Results  Labs (all labs ordered are listed, but only abnormal results are displayed) Labs Reviewed - No data to display  EKG   Radiology No results found.  Procedures Procedures (including critical care time)  Medications Ordered in UC Medications - No data to display  Initial Impression / Assessment and Plan / UC Course  I have reviewed the triage vital signs and the nursing notes.  Pertinent labs & imaging results that were available during my care of the patient were reviewed by me and considered in my medical decision making (see chart for details).   58 year old female presents for redness, swelling and pain to the left side of her face for the past week.  She was seen here 4 days ago and treated with Medrol Dosepak which has not helped.   Symptoms have since worsened.  She is afebrile.  BP is elevated at 176/85.  Patient takes enalapril.  Advised to continue medication and check BP regularly.  If consistently greater than 140/90 should follow-up with PCP.  See images included in chart of patient's face.  Consistent with cellulitis and developing abscess.  She has a history of MRSA.  Other medical history significant for lupus and rosacea.  Patient has extensive allergy list.  She says the only medication she can really take is clindamycin .  Prescribed for 450 mg 4 times daily x 7 days.  Also sent mupirocin  ointment topical.  Discussed wound care guidelines.  Advised close monitoring.  Advised if not improving in the next couple days or symptoms worsen she should go to emergency department.   Final Clinical Impressions(s) / UC Diagnoses   Final diagnoses:  Facial cellulitis  History of MRSA infection  History of systemic lupus erythematosus (HCC)  Essential hypertension     Discharge Instructions      - Do not finish the corticosteroids. - Begin the antibiotics.  Clean with antiseptic wash and use topical mupirocin  ointment 2-3 times daily. - This should be looking much better in the next 2 days.  If it is not or symptoms worsen please go to the ER.     ED Prescriptions     Medication Sig Dispense Auth. Provider   clindamycin  (CLEOCIN ) 150 MG capsule Take 3 capsules (450 mg total) by mouth 4 (four) times daily for 7 days. 84 capsule Arvis Huxley B, PA-C   mupirocin  ointment (BACTROBAN ) 2 % Apply 1 Application  topically 2 (two) times daily. 22 g Arvis Jolan NOVAK, PA-C      PDMP not reviewed this encounter.   Arvis Jolan NOVAK, PA-C 05/30/24 1430

## 2024-05-30 NOTE — ED Triage Notes (Signed)
 Pt c/o rash on L side of face x1 wk. Was seen here for the same issue. Given medrol dosepak w/o relief. Has 2 doses left.

## 2024-05-30 NOTE — Discharge Instructions (Addendum)
-   Do not finish the corticosteroids. - Begin the antibiotics.  Clean with antiseptic wash and use topical mupirocin  ointment 2-3 times daily. - This should be looking much better in the next 2 days.  If it is not or symptoms worsen please go to the ER.

## 2024-06-18 ENCOUNTER — Encounter: Payer: Self-pay | Admitting: Emergency Medicine

## 2024-06-18 ENCOUNTER — Ambulatory Visit
Admission: EM | Admit: 2024-06-18 | Discharge: 2024-06-18 | Disposition: A | Discharge status: Home / Self Care | Attending: Physician Assistant | Admitting: Physician Assistant

## 2024-06-18 DIAGNOSIS — M329 Systemic lupus erythematosus, unspecified: Secondary | ICD-10-CM | POA: Diagnosis not present

## 2024-06-18 DIAGNOSIS — Z8614 Personal history of Methicillin resistant Staphylococcus aureus infection: Secondary | ICD-10-CM

## 2024-06-18 DIAGNOSIS — L03211 Cellulitis of face: Secondary | ICD-10-CM

## 2024-06-18 MED ORDER — CLINDAMYCIN HCL 150 MG PO CAPS: 450.0000 mg | ORAL_CAPSULE | Freq: Four times a day (QID) | ORAL | 0 refills | Status: AC

## 2024-06-18 MED ORDER — CEFTRIAXONE SODIUM 1 G IJ SOLR
1.0000 g | Freq: Once | INTRAMUSCULAR | Status: AC
  Administered 2024-06-18: 1 g via INTRAMUSCULAR

## 2024-06-18 NOTE — ED Provider Notes (Signed)
 [facility]    CSN: [id number] Arrival date & time: 06/18/24  1340      History   Chief Complaint Chief Complaint  Patient presents with   Not better     HPI ARYIANNA EARWOOD is a 58 y.o. female presenting for 3-4 week history of swelling/redness to the left side of her face. She was initially seen in this department 05/26/24  and treated with  medrol which did not help and might had made symptoms worse. She followed up here 05/30/24 and was treated with Clindamycin  and Mupirocin  x 1 week. Symptoms improved about 50%. No fevers. She does report headaches. No dizziness, weakness, chest pain or SOB.  Medical history is significant for Lupus, MRSA, hypertension, hyperlipidemia, rosacea, neuropathy, COPD, and seizures.   HPI  Past Medical History:  Diagnosis Date   Anxiety    Arthritis    Discoid lupus    Hyperlipidemia    Hypertension    Lupus (systemic lupus erythematosus) (HCC)    MRSA (methicillin resistant staph aureus) culture positive    Neuropathy    Rosacea    Seizures (HCC)     Patient Active Problem List   Diagnosis Date Noted   Anxiety 05/03/2024   Hyperlipidemia 05/03/2024   Hypertension 05/03/2024   Syncope and collapse 05/03/2024   Hyponatremia 05/03/2024   Hypotension 05/03/2024   Chronic obstructive pulmonary disease (HCC) 04/11/2024   Systemic lupus erythematosus (HCC) 09/09/2023   Acute URI 06/28/2023   Sinus pressure 06/28/2023   Acute cough 06/28/2023   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      Home  Medications    Prior to Admission medications   Medication Sig Start Date End Date Taking? Authorizing Provider  clindamycin  (CLEOCIN ) 150 MG capsule Take 3 capsules (450 mg total) by mouth every 6 (six) hours for 7 days. 06/18/24 06/25/24 Yes Arvis Huxley B, PA-C  clobetasol cream (TEMOVATE) 0.05 % Apply 1 Application topically 2 (two) times daily as needed. 02/03/16   [provider]  clonazePAM  (KLONOPIN ) 1 MG tablet Take 1 mg by mouth 2 (two) times daily.    [provider]  cyanocobalamin (,VITAMIN B-12,) 1000 MCG/ML injection Inject 1,000 mcg into the muscle every 30 (thirty) days.    [provider]  diphenhydrAMINE  (BENADRYL ) 25 mg capsule Take 50 mg by mouth at bedtime.    [provider]  enalapril (VASOTEC) 20 MG tablet Take 20 mg by mouth at bedtime. 05/09/18 05/22/25  [provider]  ergocalciferol (VITAMIN D2) 50000 UNITS capsule Take 50,000 Units by mouth once a week. (Saturday or Sunday)    [provider]  FLUoxetine  (PROZAC ) 20 MG capsule Take 20 mg by mouth daily.    [provider]  gabapentin  (NEURONTIN ) 300 MG capsule Take 300-600 mg by mouth 2 (two) times daily. Take one capsule every morning and two capsules every night p.o.    [provider]  methylPREDNISolone (MEDROL DOSEPAK) 4 MG TBPK tablet Take according to the package insert. 05/26/24   Bernardino Ditch, NP  mupirocin   ointment (BACTROBAN ) 2 % Apply 1 Application topically 2 (two) times daily. 05/30/24   Arvis Jolan NOVAK, PA-C  pantoprazole (PROTONIX) 40 MG tablet Take 40 mg by mouth daily.    [provider]  spironolactone (ALDACTONE) 25 MG tablet Take 25 mg by mouth daily. 05/22/24 05/22/25  [provider]    Family History Family History  Problem Relation Age of Onset   Congestive Heart Failure Mother    Diabetes Mother    Hypertension Mother    Cancer Father    Hypertension Father    Hyperlipidemia Father     Social  History Social History   Tobacco Use   Smoking status: Former    Current packs/day: 0.00    Types: Cigarettes    Quit date: 07/21/2009    Years since quitting: 14.9   Smokeless tobacco: Never  Vaping Use   Vaping status: Never Used  Substance Use Topics   Alcohol use: Yes    Comment: social   Drug use: No     Allergies   Amoxicillin, Epinephrine, Sulfa antibiotics, Ciprofloxacin, Dilantin [phenytoin sodium extended], Doxycycline, Imuran  [azathioprine ], Phenobarbital, and Sulfasalazine   Review of Systems Review of Systems  Constitutional:  Negative for fatigue.  Respiratory:  Negative for shortness of breath.   Cardiovascular:  Negative for chest pain and palpitations.  Skin:  Positive for color change and rash.  Neurological:  Positive for headaches. Negative for dizziness and weakness.     Physical Exam Triage Vital Signs ED Triage Vitals  Encounter Vitals Group     BP 05/30/24 1334 (!) 176/85     Girls Systolic BP Percentile --      Girls Diastolic BP Percentile --      Boys Systolic BP Percentile --      Boys Diastolic BP Percentile --      Pulse Rate 05/30/24 1334 75     Resp 05/30/24 1334 16     Temp 05/30/24 1334 98.1 F (36.7 C)     Temp Source 05/30/24 1334 Oral     SpO2 05/30/24 1334 99 %     Weight 05/30/24 1333 130 lb 4.7 oz (59.1 kg)     Height 05/30/24 1333 5' 5 (1.651 m)     Head Circumference --      Peak Flow --      Pain Score 05/30/24 1336 10     Pain Loc --      Pain Education --      Exclude from Growth Chart --    No data found.  Updated Vital Signs BP 117/68 (BP Location: Left Arm)   Pulse 62   Temp 98.2 F (36.8 C) (Oral)   Resp 16   SpO2 96%     Physical Exam Vitals and nursing note reviewed.  Constitutional:      General: She is not in acute distress.    Appearance: Normal appearance. She is not ill-appearing or toxic-appearing.  HENT:     Head: Normocephalic and atraumatic.     Right Ear: Tympanic membrane, ear  canal and external ear normal.     Nose: Nose normal.     Mouth/Throat:     Mouth: Mucous membranes are moist.     Pharynx: Oropharynx is clear.  Eyes:     General: No scleral icterus.       Right eye: No discharge.        Left eye: No discharge.     Conjunctiva/sclera: Conjunctivae normal.  Cardiovascular:  Rate and Rhythm: Normal rate and regular rhythm.     Heart sounds: Normal heart sounds.  Pulmonary:     Effort: Pulmonary effort is normal. No respiratory distress.     Breath sounds: Normal breath sounds.  Musculoskeletal:     Cervical back: Neck supple.  Skin:    General: Skin is dry.     Findings: Erythema and rash present.     Comments: See images included in chart. There is erythema/swelling/mild induration of the left preauricular area. No drainage. Area is diffusely tender. Improved from 10/1  Neurological:     General: No focal deficit present.     Mental Status: She is alert. Mental status is at baseline.     Motor: No weakness.     Gait: Gait normal.  Psychiatric:        Mood and Affect: Mood normal.        Behavior: Behavior normal.     05/30/24        Today    UC Treatments / Results  Labs (all labs ordered are listed, but only abnormal results are displayed) Labs Reviewed - No data to display  EKG   Radiology No results found.  Procedures Procedures (including critical care time)  Medications Ordered in UC Medications  cefTRIAXone (ROCEPHIN) injection 1 g (1 g Intramuscular Given 06/18/24 1527)    Initial Impression / Assessment and Plan / UC Course  I have reviewed the triage vital signs and the nursing notes.  Pertinent labs & imaging results that were available during my care of the patient were reviewed by me and considered in my medical decision making (see chart for details).   58 year old female presents for redness, swelling and pain to the left side of her face for the past 3-4 weeks. This is her 3rd visit to this urgent care.  She was seen here 19 days ago and treated with Clindamycin  and mupirocin . Symptoms have improved 50% or more.  She is afebrile.   See images included in chart of patient's face.  Consistent with cellulitis.  She has a history of MRSA.  Other medical history significant for lupus and rosacea.  Patient has extensive allergy list.  She says the only medication she can really take is clindamycin .  Prescribed for 450 mg 4 times daily x 7 days. She was given injection of Rocephin 1 g. Per patient she has tolerated cephalosporins. Discussed wound care guidelines.  Advised close monitoring.  Advised if not improving in the next couple days or symptoms worsen she should go to emergency department. Derm referral placed.   Final Clinical Impressions(s) / UC Diagnoses   Final diagnoses:  Facial cellulitis  History of MRSA infection  History of systemic lupus erythematosus (HCC)     Discharge Instructions      -You were given ceftriaxone antibiotic injection today.  -Begin the antibiotics today.  Clean with antiseptic wash and use topical mupirocin  ointment 2-3 times daily. - This should be looking much better in the next 2 days.  If it is not or symptoms worsen please go to the ER. -I put a referral in for dermatology        ED Prescriptions     Medication Sig Dispense Auth. Provider   clindamycin  (CLEOCIN ) 150 MG capsule Take 3 capsules (450 mg total) by mouth every 6 (six) hours for 7 days. 84 capsule Arvis Jolan NOVAK, PA-C      PDMP not reviewed this encounter.   Arvis Jolan NOVAK,  PA-C 05/30/24 1430    Arvis Jolan NOVAK, PA-C 06/18/24 1536

## 2024-06-18 NOTE — ED Triage Notes (Signed)
 Pt was seen 92/7 and 10/1 for a rash on the left side of her face. Pt has facial pain and a headache. The medication that has been prescribed is not helping. Pt believes it is shingles.

## 2024-06-18 NOTE — Discharge Instructions (Addendum)
-  You were given ceftriaxone antibiotic injection today.  -Begin the antibiotics today.  Clean with antiseptic wash and use topical mupirocin  ointment 2-3 times daily. - This should be looking much better in the next 2 days.  If it is not or symptoms worsen please go to the ER. -I put a referral in for dermatology

## 2024-07-05 ENCOUNTER — Encounter (HOSPITAL_BASED_OUTPATIENT_CLINIC_OR_DEPARTMENT_OTHER): Payer: Self-pay | Admitting: Internal Medicine

## 2024-07-05 DIAGNOSIS — R5383 Other fatigue: Secondary | ICD-10-CM

## 2024-07-05 DIAGNOSIS — R0681 Apnea, not elsewhere classified: Secondary | ICD-10-CM

## 2024-07-05 DIAGNOSIS — R519 Headache, unspecified: Secondary | ICD-10-CM

## 2024-07-05 DIAGNOSIS — R5381 Other malaise: Secondary | ICD-10-CM

## 2024-07-05 DIAGNOSIS — R0683 Snoring: Secondary | ICD-10-CM

## 2024-07-05 DIAGNOSIS — I10 Essential (primary) hypertension: Secondary | ICD-10-CM
